# Patient Record
Sex: Female | Born: 1950 | Race: White | Hispanic: No | Marital: Married | Smoking: Former smoker
Health system: Southern US, Community
[De-identification: ages and names within clinical notes are randomized; demographics above are authoritative.]

## PROBLEM LIST (undated history)

## (undated) DIAGNOSIS — N83209 Unspecified ovarian cyst, unspecified side: Secondary | ICD-10-CM

## (undated) DIAGNOSIS — F329 Major depressive disorder, single episode, unspecified: Secondary | ICD-10-CM

## (undated) DIAGNOSIS — K635 Polyp of colon: Secondary | ICD-10-CM

## (undated) DIAGNOSIS — R059 Cough, unspecified: Secondary | ICD-10-CM

## (undated) DIAGNOSIS — F419 Anxiety disorder, unspecified: Secondary | ICD-10-CM

## (undated) DIAGNOSIS — K219 Gastro-esophageal reflux disease without esophagitis: Secondary | ICD-10-CM

## (undated) DIAGNOSIS — R05 Cough: Secondary | ICD-10-CM

## (undated) DIAGNOSIS — F32A Depression, unspecified: Secondary | ICD-10-CM

## (undated) DIAGNOSIS — I10 Essential (primary) hypertension: Secondary | ICD-10-CM

## (undated) HISTORY — DX: Anxiety disorder, unspecified: F41.9

## (undated) HISTORY — DX: Cough: R05

## (undated) HISTORY — DX: Gastro-esophageal reflux disease without esophagitis: K21.9

## (undated) HISTORY — DX: Polyp of colon: K63.5

## (undated) HISTORY — PX: WRIST SURGERY: SHX841

## (undated) HISTORY — DX: Major depressive disorder, single episode, unspecified: F32.9

## (undated) HISTORY — DX: Unspecified ovarian cyst, unspecified side: N83.209

## (undated) HISTORY — DX: Depression, unspecified: F32.A

## (undated) HISTORY — DX: Cough, unspecified: R05.9

---

## 1980-07-04 HISTORY — PX: TOTAL ABDOMINAL HYSTERECTOMY: SHX209

## 1980-07-04 HISTORY — PX: BREAST BIOPSY: SHX20

## 2002-07-04 HISTORY — PX: CERVICAL DISC SURGERY: SHX588

## 2012-06-19 ENCOUNTER — Emergency Department (INDEPENDENT_AMBULATORY_CARE_PROVIDER_SITE_OTHER)
Admission: EM | Admit: 2012-06-19 | Discharge: 2012-06-19 | Disposition: A | Discharge status: Home / Self Care | Payer: Commercial Managed Care - PPO | Source: Home / Self Care | Attending: Family Medicine | Admitting: Family Medicine

## 2012-06-19 ENCOUNTER — Encounter: Payer: Self-pay | Admitting: Emergency Medicine

## 2012-06-19 DIAGNOSIS — J069 Acute upper respiratory infection, unspecified: Secondary | ICD-10-CM

## 2012-06-19 HISTORY — DX: Essential (primary) hypertension: I10

## 2012-06-19 MED ORDER — BENZONATATE 200 MG PO CAPS: 200.0000 mg | ORAL_CAPSULE | Freq: Every day | ORAL | Status: DC

## 2012-06-19 MED ORDER — SULFAMETHOXAZOLE-TRIMETHOPRIM 800-160 MG PO TABS: 1.0000 | ORAL_TABLET | Freq: Two times a day (BID) | ORAL | Status: DC

## 2012-06-19 NOTE — ED Notes (Signed)
Dry cough, headache, sore throat, runny nose, exposed to strep from granddaughter, started amoxicillin x 5 days ago

## 2012-06-19 NOTE — ED Provider Notes (Signed)
History     CSN: [id number]  Arrival date & time 06/19/12  1054   First MD Initiated Contact with Patient 06/19/12 1124      Chief Complaint  Patient presents with  . Cough      HPI Comments: Patient complains of onset of a sore throat and headache about a week ago.  Her grand-daughter had been treated for strep, so patient began taking leftover amoxicillin about 5 days ago (500mg  TID).  A cough also started 5 days ago.  Complains of fatigue but no myalgias.  Cough is now worse at night and generally non-productive during the day.  She occasionally coughs until she gags.  There has been no pleuritic pain, shortness of breath, or wheezes.   The history is provided by the patient.    Past Medical History  Diagnosis Date  . Hypertension     History reviewed. No pertinent past surgical history.  Family History  Problem Relation Age of Onset  . Diabetes Mother   . Heart failure Mother     History  Substance Use Topics  . Smoking status: Former Smoker    Quit date: 06/20/1995  . Smokeless tobacco: Not on file  . Alcohol Use: No    OB History    Grav Para Term Preterm Abortions TAB SAB Ect Mult Living                  Review of Systems + sore throat + cough No pleuritic pain No wheezing + nasal congestion + post-nasal drainage No sinus pain/pressure No itchy/red eyes ? earache No hemoptysis No SOB No fever, + chills No nausea No vomiting No abdominal pain No diarrhea No urinary symptoms No skin rashes + fatigue No myalgias No headache Used OTC meds without relief  Allergies  Review of patient's allergies indicates no known allergies.  Home Medications   Current Outpatient Rx  Name  Route  Sig  Dispense  Refill  . AMOXICILLIN 500 MG PO TABS   Oral   Take 500 mg by mouth 2 (two) times daily.         . DULOXETINE HCL 60 MG PO CPEP   Oral   Take 60 mg by mouth daily.         . MOMETASONE FUROATE 50 MCG/ACT NA SUSP   Nasal   Place 2  sprays into the nose daily.         . NEBIVOLOL HCL 5 MG PO TABS   Oral   Take 5 mg by mouth daily.         . TRIAMTERENE-HCTZ 37.5-25 MG PO TABS   Oral   Take 1 tablet by mouth daily.         Marland Kitchen BENZONATATE 200 MG PO CAPS   Oral   Take 1 capsule (200 mg total) by mouth at bedtime. Take as needed for cough   12 capsule   0   . SULFAMETHOXAZOLE-TRIMETHOPRIM 800-160 MG PO TABS   Oral   Take 1 tablet by mouth 2 (two) times daily.   20 tablet   0     BP 151/85  Pulse 81  Temp 98.2 F (36.8 C) (Oral)  Resp 16  Ht 5\' 2"  (1.575 m)  Wt 190 lb (86.183 kg)  BMI 34.75 kg/m2  SpO2 96%  Physical Exam Nursing notes and Vital Signs reviewed. Appearance:  Patient appears stated age, and in no acute distress.  Patient is obese (BMI 34.8) Eyes:  Pupils are  equal, round, and reactive to light and accomodation.  Extraocular movement is intact.  Conjunctivae are not inflamed  Ears:  Canals normal.  Tympanic membranes normal.  Nose:  Mildly congested turbinates.  No sinus tenderness.   Pharynx:  Normal Neck:  Supple.  Slightly tender shotty posterior nodes are palpated bilaterally  Lungs:  Clear to auscultation.  Breath sounds are equal.  Heart:  Regular rate and rhythm without murmurs, rubs, or gallops.  Abdomen:  Nontender without masses or hepatosplenomegaly.  Bowel sounds are present.  No CVA or flank tenderness.  Extremities:  No edema.  No calf tenderness Skin:  No rash present.   ED Course  Procedures none   Labs Reviewed  STREP A DNA PROBE pending      1. Acute upper respiratory infections of unspecified site; ?pertussis       MDM  Since patient has been exposed to strep pharyngitis, well send throat culture (throat appears normal however) Begin Septra DS to cover possible pertussis. Prescription written for Benzonatate [facility]) to take at bedtime for night-time cough.  Take plain Mucinex (guaifenesin) twice daily for cough and congestion.  Increase fluid  intake, rest. May use Afrin nasal spray (or generic oxymetazoline) twice daily for about 5 days.  Also recommend using saline nasal spray several times daily and saline nasal irrigation (AYR is a common brand) Stop all antihistamines for now, and other non-prescription cough/cold preparations. Recommend Tdap when well Follow-up with family doctor if not improving 7 to 10 days.         Lattie Haw, MD 06/25/12 [phone]

## 2012-06-20 LAB — STREP A DNA PROBE: GASP: NEGATIVE

## 2012-06-23 ENCOUNTER — Encounter: Payer: Self-pay | Admitting: Emergency Medicine

## 2012-06-23 ENCOUNTER — Emergency Department (INDEPENDENT_AMBULATORY_CARE_PROVIDER_SITE_OTHER)
Admission: EM | Admit: 2012-06-23 | Discharge: 2012-06-23 | Disposition: A | Discharge status: Home / Self Care | Payer: Commercial Managed Care - PPO | Source: Home / Self Care | Attending: Family Medicine | Admitting: Family Medicine

## 2012-06-23 DIAGNOSIS — R05 Cough: Secondary | ICD-10-CM

## 2012-06-23 DIAGNOSIS — S39011A Strain of muscle, fascia and tendon of abdomen, initial encounter: Secondary | ICD-10-CM

## 2012-06-23 DIAGNOSIS — IMO0002 Reserved for concepts with insufficient information to code with codable children: Secondary | ICD-10-CM

## 2012-06-23 MED ORDER — HYDROCOD POLST-CHLORPHEN POLST 10-8 MG/5ML PO LQCR: 5.0000 mL | Freq: Two times a day (BID) | ORAL | Status: DC | PRN

## 2012-06-23 NOTE — ED Notes (Signed)
Reports feeling pulling sensation when she coughed the other night in her left groin area; was seen earlier in week for URI.

## 2012-06-23 NOTE — ED Provider Notes (Signed)
History     CSN: [id number]  Arrival date & time 06/23/12  1145   First MD Initiated Contact with Patient 06/23/12 1219      Chief Complaint  Patient presents with  . Groin Pain   HPI  Pt presents today with chief complaint of abd/groin pain x 2-3 days.  Pt noted have been seen for URI/strep throat earlier in the week. Rapid strep negative.  Was placed on septra for pertussis coverage. Also on tessalon perles for cough.  Pt states that she has had persistent cough. Pt states that she was getting out of bed earlier in the week and simultaneously coughed and noticed LLQ/groin pain.  Pain has been intermittent since this point. Only with coughing, hip flexion.  No numbness or paresthesias.  No bowel or bladder dysfunction.  No fevers or chills.  Appetite has been stable.  No fevers or chills.    Past Medical History  Diagnosis Date  . Hypertension     History reviewed. No pertinent past surgical history.  Family History  Problem Relation Age of Onset  . Diabetes Mother   . Heart failure Mother     History  Substance Use Topics  . Smoking status: Former Smoker    Quit date: 06/20/1995  . Smokeless tobacco: Not on file  . Alcohol Use: No    OB History    Grav Para Term Preterm Abortions TAB SAB Ect Mult Living                  Review of Systems  All other systems reviewed and are negative.    Allergies  Review of patient's allergies indicates no known allergies.  Home Medications   Current Outpatient Rx  Name  Route  Sig  Dispense  Refill  . AMOXICILLIN 500 MG PO TABS   Oral   Take 500 mg by mouth 2 (two) times daily.         Marland Kitchen BENZONATATE 200 MG PO CAPS   Oral   Take 1 capsule (200 mg total) by mouth at bedtime. Take as needed for cough   12 capsule   0   . HYDROCOD POLST-CPM POLST ER 10-8 MG/5ML PO LQCR   Oral   Take 5 mLs by mouth every 12 (twelve) hours as needed (cough).   60 mL   0   . DULOXETINE HCL 60 MG PO CPEP   Oral   Take  60 mg by mouth daily.         . MOMETASONE FUROATE 50 MCG/ACT NA SUSP   Nasal   Place 2 sprays into the nose daily.         . NEBIVOLOL HCL 5 MG PO TABS   Oral   Take 5 mg by mouth daily.         . SULFAMETHOXAZOLE-TRIMETHOPRIM 800-160 MG PO TABS   Oral   Take 1 tablet by mouth 2 (two) times daily.   20 tablet   0   . TRIAMTERENE-HCTZ 37.5-25 MG PO TABS   Oral   Take 1 tablet by mouth daily.           BP 134/77  Pulse 78  Temp 99.4 F (37.4 C) (Oral)  Resp 18  Ht 5\' 1"  (1.549 m)  Wt 191 lb (86.637 kg)  BMI 36.09 kg/m2  SpO2 95%  Physical Exam  Vitals reviewed. Constitutional: She appears well-developed and well-nourished.  HENT:  Head: Normocephalic and atraumatic.  Right Ear: External ear normal.  Left Ear: External ear normal.       Mild +nasal erythema, rhinorrhea bilaterally, + post oropharyngeal erythema    Eyes: Conjunctivae normal are normal. Pupils are equal, round, and reactive to light.  Neck: Normal range of motion. Neck supple.  Cardiovascular: Normal rate, regular rhythm and normal heart sounds.   Pulmonary/Chest: Effort normal and breath sounds normal.  Abdominal: Soft. Bowel sounds are normal.      ED Course  Procedures (including critical care time)  Labs Reviewed - No data to display No results found.   1. Abdominal muscle strain   2. Cough       MDM  Will place on tussionex for cough.  This should also help with abdominal strain.  RICE and tylenol.  Avoiding NSAIDs given HTN unless necessary.  Discussed infectious and GI red flags.  Follow up as needed.      The patient and/or caregiver has been counseled thoroughly with regard to treatment plan and/or medications prescribed including dosage, schedule, interactions, rationale for use, and possible side effects and they verbalize understanding. Diagnoses and expected course of recovery discussed and will return if not improved as expected or if the condition worsens.  Patient and/or caregiver verbalized understanding.             Doree Albee, MD 06/23/12 [phone]

## 2013-01-21 ENCOUNTER — Other Ambulatory Visit: Payer: Self-pay | Admitting: *Deleted

## 2013-01-21 ENCOUNTER — Encounter: Payer: Self-pay | Admitting: Critical Care Medicine

## 2013-01-21 ENCOUNTER — Ambulatory Visit (INDEPENDENT_AMBULATORY_CARE_PROVIDER_SITE_OTHER): Payer: Commercial Managed Care - PPO | Admitting: Critical Care Medicine

## 2013-01-21 VITALS — BP 142/84 | HR 75 | Temp 98.6°F | Ht 61.5 in | Wt 191.0 lb

## 2013-01-21 DIAGNOSIS — E559 Vitamin D deficiency, unspecified: Secondary | ICD-10-CM

## 2013-01-21 DIAGNOSIS — Z0000 Encounter for general adult medical examination without abnormal findings: Secondary | ICD-10-CM

## 2013-01-21 DIAGNOSIS — R05 Cough: Secondary | ICD-10-CM

## 2013-01-21 MED ORDER — HYDROCODONE-HOMATROPINE 5-1.5 MG/5ML PO SYRP: 5.0000 mL | ORAL_SOLUTION | Freq: Four times a day (QID) | ORAL | Status: DC | PRN

## 2013-01-21 MED ORDER — PREDNISONE 10 MG PO TABS: ORAL_TABLET | ORAL | Status: DC

## 2013-01-21 MED ORDER — BENZONATATE 200 MG PO CAPS: 200.0000 mg | ORAL_CAPSULE | Freq: Every day | ORAL | Status: DC

## 2013-01-21 MED ORDER — BENZONATATE 200 MG PO CAPS: ORAL_CAPSULE | ORAL | Status: DC

## 2013-01-21 MED ORDER — CHLORPHENIRAMINE MALEATE 4 MG PO TABS: 8.0000 mg | ORAL_TABLET | Freq: Every day | ORAL | Status: DC

## 2013-01-21 MED ORDER — MOMETASONE FUROATE 50 MCG/ACT NA SUSP: 2.0000 | Freq: Every day | NASAL | Status: DC

## 2013-01-21 NOTE — Patient Instructions (Addendum)
Stay on omeprazole daily take 1/2 hour before meals Reflux diet Stay on nasonex two puff daily  Start chlorpheniramine 8mg  every night Prednisone 10mg  Take 4 for two days three for two days two for two days one for two days Start Cyclic cough protocol with hycodan and benzonatate Return 1 month

## 2013-01-21 NOTE — Progress Notes (Signed)
Subjective:    Patient ID: Theresa Lamb, female    DOB: 1951-04-06, 62 y.o.   MRN: [id number]  HPI Comments: Chronic cough since November 2013.  Essentially dry cough.  Pt saw GI and ENT and dx reflux. Rx for same and helped some   Cough This is a new problem. The current episode started more than 1 month ago. The problem has been gradually improving. The cough is non-productive. Associated symptoms include nasal congestion, postnasal drip and rhinorrhea. Pertinent negatives include no chest pain, chills, ear congestion, ear pain, fever, headaches, heartburn, hemoptysis, myalgias, rash, sore throat, shortness of breath, sweats, weight loss or wheezing. Associated symptoms comments: Notes some sinus pressure, some clear rhinitis. Exacerbated by: worse after eating.  if talks alot  Risk factors for lung disease include smoking/tobacco exposure (quit smoking 16 yrs ago). She has tried prescription cough suppressant (hycodan helps at night) for the symptoms. The treatment provided significant relief. Her past medical history is significant for bronchitis. There is no history of asthma, bronchiectasis, COPD, emphysema, environmental allergies or pneumonia. dx bronchitis and Rx abx this past winter    Past Medical History  Diagnosis Date  . Hypertension   . Depression   . Anxiety   . Ovarian cyst   . GERD (gastroesophageal reflux disease)   . Colon polyp   . Cough      Family History  Problem Relation Age of Onset  . Diabetes Mother   . Heart failure Mother   . Heart disease Mother   . Cancer Mother     Uterine Cancer/ Bladder Cancer  . Heart disease Father   . Sarcoidosis Sister   . Heart disease Brother   . Diabetes Maternal Aunt   . Diabetes Maternal Uncle   . Diabetes Maternal Grandmother   . Melanoma Maternal Grandmother      History   Social History  . Marital Status: Married    Spouse Name: N/A    Number of Children: N/A  . Years of Education: N/A   Occupational  History  . Front Office    Social History Main Topics  . Smoking status: Former Smoker -- 1.00 packs/day for 15 years    Types: Cigarettes    Quit date: 05/04/1997  . Smokeless tobacco: Never Used  . Alcohol Use: No  . Drug Use: No  . Sexually Active: Yes   Other Topics Concern  . 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)    Education:  [employer]   Tobacco Use/Exposure: She quit smoking in 1998 after 20 years of 1/2 to 1 ppd   Alcohol Use:  Rarely   Drug Use:  None   Diet:  Weight Watchers   Exercise:  None   Hobbies:  Knitting              Allergies  Allergen Reactions  . Morphine And Related     itching     Outpatient Prescriptions Prior to Visit  Medication Sig Dispense Refill  . cyclobenzaprine (FLEXERIL) 10 MG tablet Take 10 mg by mouth 3 (three) times daily as needed for muscle spasms.      . DULoxetine (CYMBALTA) 60 MG capsule Take 60 mg by mouth daily.      . Estradiol (VAGIFEM) 10 MCG TABS Place 1 tablet vaginally 3 (three) times a week.      Marland Kitchen  nebivolol (BYSTOLIC) 5 MG tablet Take 5 mg by mouth daily.      Marland Kitchen omeprazole (PRILOSEC) 40 MG capsule Take 40 mg by mouth daily.      Marland Kitchen triamterene-hydrochlorothiazide (MAXZIDE-25) 37.5-25 MG per tablet Take 1 tablet by mouth daily.      . benzonatate (TESSALON) 200 MG capsule Take 1 capsule (200 mg total) by mouth at bedtime. Take as needed for cough  12 capsule  0  . HYDROcodone-homatropine (HYCODAN) 5-1.5 MG/5ML syrup Take 5 mLs by mouth every 6 (six) hours as needed for cough.      . mometasone (NASONEX) 50 MCG/ACT nasal spray Place 2 sprays into the nose daily as needed.       Marland Kitchen amoxicillin (AMOXIL) 500 MG tablet Take 500 mg by mouth 2 (two) times daily.      . chlorpheniramine-HYDROcodone (TUSSIONEX PENNKINETIC ER) 10-8 MG/5ML LQCR Take 5 mLs by mouth every 12 (twelve) hours as  needed (cough).  60 mL  0  . dexlansoprazole (DEXILANT) 60 MG capsule Take 60 mg by mouth daily.      . hyoscyamine (LEVSIN, ANASPAZ) 0.125 MG tablet Take 0.125 mg by mouth every 4 (four) hours as needed for cramping.      . sulfamethoxazole-trimethoprim (BACTRIM DS,SEPTRA DS) 800-160 MG per tablet Take 1 tablet by mouth 2 (two) times daily.  20 tablet  0  . zoster vaccine live, PF, (ZOSTAVAX) 65784 UNT/0.65ML injection Inject 0.65 mLs into the skin once.       No facility-administered medications prior to visit.      Review of Systems  Constitutional: Positive for fatigue. Negative for fever, chills, weight loss, diaphoresis, activity change, appetite change and unexpected weight change.  HENT: Positive for congestion, rhinorrhea, postnasal drip and sinus pressure. Negative for hearing loss, ear pain, nosebleeds, sore throat, facial swelling, sneezing, mouth sores, trouble swallowing, neck pain, neck stiffness, dental problem, voice change, tinnitus and ear discharge.   Eyes: Negative for photophobia, discharge, itching and visual disturbance.  Respiratory: Positive for cough. Negative for apnea, hemoptysis, choking, chest tightness, shortness of breath, wheezing and stridor.   Cardiovascular: Negative for chest pain, palpitations and leg swelling.  Gastrointestinal: Negative for heartburn, nausea, vomiting, abdominal pain, constipation, blood in stool and abdominal distention.  Genitourinary: Negative for dysuria, urgency, frequency, hematuria, flank pain, decreased urine volume and difficulty urinating.  Musculoskeletal: Negative for myalgias, back pain, joint swelling, arthralgias and gait problem.  Skin: Negative for color change, pallor and rash.  Allergic/Immunologic: Negative for environmental allergies.  Neurological: Negative for dizziness, tremors, seizures, syncope, speech difficulty, weakness, light-headedness, numbness and headaches.  Hematological: Negative for adenopathy. Does  not bruise/bleed easily.  Psychiatric/Behavioral: Negative for confusion, sleep disturbance and agitation. The patient is not nervous/anxious.        Objective:   Physical Exam Filed Vitals:   01/21/13 1114  BP: 142/84  Pulse: 75  Temp: 98.6 F (37 C)  TempSrc: Oral  Height: 5' 1.5" (1.562 m)  Weight: 191 lb (86.637 kg)  SpO2: 95%    Gen: Pleasant, well-nourished, in no distress,  normal affect  ENT: No lesions,  mouth clear,  oropharynx clear, +++ postnasal drip  Neck: No JVD, no TMG, no carotid bruits  Lungs: No use of accessory muscles, no dullness to percussion, prominent pseudowheeze  Cardiovascular: RRR, heart sounds normal, no murmur or gallops, no peripheral edema  Abdomen: soft and NT, no HSM,  BS normal  Musculoskeletal: No deformities, no cyanosis or clubbing  Neuro: alert,  non focal  Skin: Warm, no lesions or rashes  No results found.        Assessment & Plan:   Cough Cyclical cough d/t upper airway instability d/t post nasal drip, GERD. Doubt primary lung disease  Stay on omeprazole daily take 1/2 hour before meals Reflux diet Stay on nasonex two puff daily  Start chlorpheniramine 8mg  every night Prednisone 10mg  Take 4 for two days three for two days two for two days one for two days Start Cyclic cough protocol with hycodan and benzonatate Return 1 month     Updated Medication List Outpatient Encounter Prescriptions as of 01/21/2013  Medication Sig Dispense Refill  . benzonatate (TESSALON) 200 MG capsule Take per cough protocol  1-2 every 4hours as needed  90 capsule  4  . cetirizine (ZYRTEC) 10 MG tablet Take 10 mg by mouth daily.      . cholecalciferol (VITAMIN D) 1000 UNITS tablet Take 1,000 Units by mouth daily.      . cyclobenzaprine (FLEXERIL) 10 MG tablet Take 10 mg by mouth 3 (three) times daily as needed for muscle spasms.      . DULoxetine (CYMBALTA) 60 MG capsule Take 60 mg by mouth daily.      . Estradiol (VAGIFEM) 10 MCG TABS  Place 1 tablet vaginally 3 (three) times a week.      Marland Kitchen HYDROcodone-homatropine (HYCODAN) 5-1.5 MG/5ML syrup Take 5 mLs by mouth every 6 (six) hours as needed for cough.  120 mL  0  . mometasone (NASONEX) 50 MCG/ACT nasal spray Place 2 sprays into the nose daily.  17 g  6  . nebivolol (BYSTOLIC) 5 MG tablet Take 5 mg by mouth daily.      Marland Kitchen omeprazole (PRILOSEC) 40 MG capsule Take 40 mg by mouth daily.      Marland Kitchen triamterene-hydrochlorothiazide (MAXZIDE-25) 37.5-25 MG per tablet Take 1 tablet by mouth daily.      . [DISCONTINUED] benzonatate (TESSALON) 200 MG capsule Take 1 capsule (200 mg total) by mouth at bedtime. Take as needed for cough  12 capsule  0  . [DISCONTINUED] benzonatate (TESSALON) 200 MG capsule Take 1 capsule (200 mg total) by mouth at bedtime. Take as needed for cough  12 capsule  0  . [DISCONTINUED] HYDROcodone-homatropine (HYCODAN) 5-1.5 MG/5ML syrup Take 5 mLs by mouth every 6 (six) hours as needed for cough.      . [DISCONTINUED] HYDROcodone-homatropine (HYCODAN) 5-1.5 MG/5ML syrup Take 5 mLs by mouth every 6 (six) hours as needed for cough.  120 mL  0  . [DISCONTINUED] mometasone (NASONEX) 50 MCG/ACT nasal spray Place 2 sprays into the nose daily as needed.       . chlorpheniramine (CHLOR-TRIMETON) 4 MG tablet Take 2 tablets (8 mg total) by mouth at bedtime.  60 tablet  6  . predniSONE (DELTASONE) 10 MG tablet Take 4 for two days three for two days two for two days one for two days  20 tablet  0  . [DISCONTINUED] amoxicillin (AMOXIL) 500 MG tablet Take 500 mg by mouth 2 (two) times daily.      . [DISCONTINUED] chlorpheniramine-HYDROcodone (TUSSIONEX PENNKINETIC ER) 10-8 MG/5ML LQCR Take 5 mLs by mouth every 12 (twelve) hours as needed (cough).  60 mL  0  . [DISCONTINUED] dexlansoprazole (DEXILANT) 60 MG capsule Take 60 mg by mouth daily.      . [DISCONTINUED] hyoscyamine (LEVSIN, ANASPAZ) 0.125 MG tablet Take 0.125 mg by mouth every 4 (four) hours as needed for cramping.      .  [  DISCONTINUED] sulfamethoxazole-trimethoprim (BACTRIM DS,SEPTRA DS) 800-160 MG per tablet Take 1 tablet by mouth 2 (two) times daily.  20 tablet  0  . [DISCONTINUED] zoster vaccine live, PF, (ZOSTAVAX) 11914 UNT/0.65ML injection Inject 0.65 mLs into the skin once.       No facility-administered encounter medications on file as of 01/21/2013.

## 2013-01-21 NOTE — Assessment & Plan Note (Addendum)
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 Doubt primary lung disease  Stay on omeprazole daily take 1/2 hour before meals Reflux diet Stay on nasonex two puff daily  Start chlorpheniramine 8mg  every night Prednisone 10mg  Take 4 for two days three for two days two for two days one for two days Start Cyclic cough protocol with hycodan and benzonatate Return 1 month

## 2013-01-22 ENCOUNTER — Other Ambulatory Visit: Payer: Commercial Managed Care - PPO

## 2013-01-22 LAB — CBC WITH DIFFERENTIAL/PLATELET
Basophils Absolute: 0 10*3/uL (ref 0.0–0.1)
Basophils Relative: 1 % (ref 0–1)
Eosinophils Absolute: 0.1 10*3/uL (ref 0.0–0.7)
Eosinophils Relative: 2 % (ref 0–5)
HCT: 45.1 % (ref 36.0–46.0)
Hemoglobin: 15.3 g/dL — ABNORMAL HIGH (ref 12.0–15.0)
Lymphocytes Relative: 41 % (ref 12–46)
Lymphs Abs: 2.9 10*3/uL (ref 0.7–4.0)
MCH: 28.4 pg (ref 26.0–34.0)
MCHC: 33.9 g/dL (ref 30.0–36.0)
MCV: 83.7 fL (ref 78.0–100.0)
Monocytes Absolute: 0.6 10*3/uL (ref 0.1–1.0)
Monocytes Relative: 8 % (ref 3–12)
Neutro Abs: 3.6 10*3/uL (ref 1.7–7.7)
Neutrophils Relative %: 48 % (ref 43–77)
Platelets: 167 10*3/uL (ref 150–400)
RBC: 5.39 MIL/uL — ABNORMAL HIGH (ref 3.87–5.11)
RDW: 13.9 % (ref 11.5–15.5)
WBC: 7.2 10*3/uL (ref 4.0–10.5)

## 2013-01-23 LAB — COMPLETE METABOLIC PANEL WITH GFR
ALT: 34 U/L (ref 0–35)
AST: 32 U/L (ref 0–37)
Albumin: 4.4 g/dL (ref 3.5–5.2)
Alkaline Phosphatase: 65 U/L (ref 39–117)
BUN: 14 mg/dL (ref 6–23)
CO2: 29 mEq/L (ref 19–32)
Calcium: 9.5 mg/dL (ref 8.4–10.5)
Chloride: 100 mEq/L (ref 96–112)
Creat: 0.82 mg/dL (ref 0.50–1.10)
GFR, Est African American: 89 mL/min
GFR, Est Non African American: 77 mL/min
Glucose, Bld: 104 mg/dL — ABNORMAL HIGH (ref 70–99)
Potassium: 4 mEq/L (ref 3.5–5.3)
Sodium: 141 mEq/L (ref 135–145)
Total Bilirubin: 0.8 mg/dL (ref 0.3–1.2)
Total Protein: 7.1 g/dL (ref 6.0–8.3)

## 2013-01-23 LAB — VITAMIN D 25 HYDROXY (VIT D DEFICIENCY, FRACTURES): Vit D, 25-Hydroxy: 45 ng/mL (ref 30–89)

## 2013-01-23 LAB — LIPID PANEL
Cholesterol: 198 mg/dL (ref 0–200)
HDL: 51 mg/dL (ref >39)
LDL Cholesterol: 124 mg/dL — ABNORMAL HIGH (ref 0–99)
Total CHOL/HDL Ratio: 3.9 Ratio
Triglycerides: 115 mg/dL (ref <150)
VLDL: 23 mg/dL (ref 0–40)

## 2013-01-23 LAB — TSH: TSH: 0.703 u[IU]/mL (ref 0.350–4.500)

## 2013-01-28 ENCOUNTER — Other Ambulatory Visit: Payer: Self-pay | Admitting: *Deleted

## 2013-01-28 MED ORDER — DULOXETINE HCL 60 MG PO CPEP: 60.0000 mg | ORAL_CAPSULE | Freq: Every day | ORAL | Status: DC

## 2013-01-29 ENCOUNTER — Ambulatory Visit: Payer: Commercial Managed Care - PPO | Admitting: Family Medicine

## 2013-01-29 ENCOUNTER — Telehealth: Payer: Self-pay | Admitting: Critical Care Medicine

## 2013-01-29 NOTE — Telephone Encounter (Signed)
Would you be okay with writing a work note? Please advise. Thanks!

## 2013-01-29 NOTE — Telephone Encounter (Signed)
Yes i am ok with writing the note

## 2013-01-30 NOTE — Telephone Encounter (Signed)
ATC pt at work and she is on lunch. I [id number] on cell number. Need to clarify what exact dates the pt needs on the letter.  Carron Curie, CMA

## 2013-01-31 ENCOUNTER — Encounter: Payer: Self-pay | Admitting: *Deleted

## 2013-01-31 NOTE — Telephone Encounter (Signed)
I spoke with the pt and she was out of work on 01/25/13 and 01/28/13. She asks that the letter be faxed to [phone]. Letter done and faxed. Carron Curie, CMA

## 2013-02-12 ENCOUNTER — Encounter: Payer: Self-pay | Admitting: Critical Care Medicine

## 2013-02-19 ENCOUNTER — Ambulatory Visit (INDEPENDENT_AMBULATORY_CARE_PROVIDER_SITE_OTHER): Payer: Commercial Managed Care - PPO | Admitting: Family Medicine

## 2013-02-19 ENCOUNTER — Encounter: Payer: Self-pay | Admitting: Family Medicine

## 2013-02-19 VITALS — BP 131/84 | HR 74 | Wt 192.0 lb

## 2013-02-19 DIAGNOSIS — R7301 Impaired fasting glucose: Secondary | ICD-10-CM | POA: Insufficient documentation

## 2013-02-19 DIAGNOSIS — Z780 Asymptomatic menopausal state: Secondary | ICD-10-CM

## 2013-02-19 DIAGNOSIS — IMO0001 Reserved for inherently not codable concepts without codable children: Secondary | ICD-10-CM

## 2013-02-19 DIAGNOSIS — I10 Essential (primary) hypertension: Secondary | ICD-10-CM

## 2013-02-19 DIAGNOSIS — N951 Menopausal and female climacteric states: Secondary | ICD-10-CM

## 2013-02-19 MED ORDER — ESTRADIOL 10 MCG VA TABS: 1.0000 | ORAL_TABLET | VAGINAL | Status: DC

## 2013-02-19 MED ORDER — DULOXETINE HCL 60 MG PO CPEP: 60.0000 mg | ORAL_CAPSULE | Freq: Every day | ORAL | Status: DC

## 2013-02-19 MED ORDER — TRIAMTERENE-HCTZ 37.5-25 MG PO TABS: 1.0000 | ORAL_TABLET | Freq: Every day | ORAL | Status: DC

## 2013-02-19 NOTE — Assessment & Plan Note (Signed)
Refilled her Vagifem.  She was reminded to get her mammogram.

## 2013-02-19 NOTE — Patient Instructions (Addendum)
1)  Blood Sugar - You could try adding Cinnamon 1000 mg and Chromium 1000 mcg.                                                      Insulin Resistance Blood sugar (glucose) levels are controlled by a hormone called insulin. Insulin is made by your pancreas. When your blood glucose goes up, insulin is released into your blood. Insulin is required for your body to function normally. However, your body can become resistant to your own insulin or to insulin given to treat diabetes. In either case, insulin resistance can lead to serious problems. These problems include:  Type 2 diabetes.  Heart disease.  High blood pressure.  Stroke.  Polycystic ovary syndrome.  Fatty liver. CAUSES  Insulin resistance can develop for many different reasons. It is more likely to happen in people with these conditions or characteristics:  Obesity.  Inactivity.  Pregnancy.  High blood pressure.  Stress.  Steroid use.  Infection or severe illness.  Increased levels of cholesterol and triglycerides. SYMPTOMS  There are no symptoms. You may have symptoms related to the various complications of insulin resistance.  DIAGNOSIS  Several different things can make your caregiver suspect you have insulin resistance. These include:  High blood glucose (hyperglycemia).  Abnormal cholesterol levels.  High uric acid levels.  Changes related to blood pressure.  Changes related to inflammation. Insulin resistance can be determined with blood tests. An elevated insulin level when you have not eaten might suggest resistance. Other more complicated tests are sometimes necessary. TREATMENT  Lifestyle changes are the most important treatment for insulin resistance.   If you are overweight and you have insulin resistance, you can improve your insulin sensitivity by losing weight.  Moderate exercise for 30 40 minutes, 4 days a week, can improve insulin sensitivity. Some medicines can also help improve your  insulin sensitivity. Your caregiver can discuss these with you if they are appropriate.  HOME CARE INSTRUCTIONS   Do not smoke.  Keep your weight at a healthy level.  Get exercise.  If you have diabetes, follow your caregiver's directions.  If you have high blood pressure, follow your caregiver's directions.  Only take prescription medicines for pain, fever, or discomfort as directed by your caregiver. SEEK MEDICAL CARE IF:   You are diabetic and you are having problems keeping your blood glucose levels at target range.  You are having episodes of low blood glucose (hypoglycemia).  You feel you might be having side effects from your medicines.  You have symptoms of an illness that is not improving after 3 4 days.  You have a sore or wound that is not healing.  You notice a change in vision or a new problem with your vision. SEEK IMMEDIATE MEDICAL CARE IF:   Your blood glucose goes below 70, especially if you have confusion, lightheadedness, or other symptoms with it.  Your blood glucose is very high (as advised by your caregiver) twice in a row.  You pass out.  You have chest pain or trouble breathing.  You have a sudden, severe headache.  You have sudden weakness in one arm or one leg.  You have sudden difficulty speaking or swallowing.  You develop vomiting or diarrhea that is getting worse or not improving after 1 day. Document Released: 08/09/2005  Document Revised: 12/20/2011 Document Reviewed: 08/07/2008 [facility] Patient Information 2014 [place], [place].

## 2013-02-19 NOTE — Assessment & Plan Note (Signed)
Refilled her Cymbalta. 

## 2013-02-19 NOTE — Progress Notes (Signed)
  Subjective:    Patient ID: Theresa Lamb, female    DOB: [birth year], 62 y.o.   MRN: [id number]  HPI  [name] is here today to go over her most recent lab results, discuss the following conditions and get medication refills.     1)  Hypertension:  Her blood pressure is well controlled with Bystolic 5 mg  and triamterene/HCTZ.   2)  HRT:  She is doing well with her Vagifem and needs a refill on it.   3)  Fibromyalgia:  She needs a refill of her Cymbalta.     Review of Systems  Constitutional: Negative.   HENT: Negative.   Eyes: Negative.   Respiratory: Negative.   Cardiovascular: Negative.   Gastrointestinal: Negative.   Endocrine: Negative.   Genitourinary: Negative.   Musculoskeletal: Negative.   Skin: Negative.   Allergic/Immunologic: Negative.   Neurological: Negative.   Hematological: Negative.   Psychiatric/Behavioral: Negative.     Past Medical History  Diagnosis Date  . Hypertension   . Depression   . Anxiety   . Ovarian cyst   . GERD (gastroesophageal reflux disease)   . Colon polyp   . Cough     Family History  Problem Relation Age of Onset  . Diabetes Mother   . Heart failure Mother   . Heart disease Mother   . Cancer Mother     Uterine Cancer/ Bladder Cancer  . Heart disease Father   . Sarcoidosis Sister   . Heart disease Brother   . Diabetes Maternal Aunt   . Diabetes Maternal Uncle   . Diabetes Maternal Grandmother   . 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)    Education:  Engineer, agricultural   Tobacco Use/Exposure: She quit smoking in 1998 after 20 years of 1/2 to 1 ppd   Alcohol Use:  Rarely   Drug Use:  None   Diet:  Weight Watchers   Exercise:  None   Hobbies:  Knitting                Objective:   Physical Exam  Constitutional: She appears well-nourished. No  distress.  HENT:  Head: Normocephalic.  Eyes: No scleral icterus.  Neck: No thyromegaly present.  Cardiovascular: Normal rate, regular rhythm and normal heart sounds.   Pulmonary/Chest: Effort normal and breath sounds normal.  Abdominal: There is no tenderness.  Musculoskeletal: She exhibits no edema and no tenderness.  Neurological: She is alert.  Skin: Skin is warm and dry.  Psychiatric: She has a normal mood and affect. Her behavior is normal. Judgment and thought content normal.          Assessment & Plan:

## 2013-02-19 NOTE — Assessment & Plan Note (Signed)
She was given enough samples to last her for 3 months.  She may be losing her insurance with [facility].  If she has to go on [facility] with [employer] then we'll send in a prescriptions for her.

## 2013-02-19 NOTE — Assessment & Plan Note (Signed)
She is going to work harder on her diet and exercise. We'll recheck a FBS and A1c in 3-6 months.

## 2013-02-21 ENCOUNTER — Ambulatory Visit: Payer: Commercial Managed Care - PPO | Admitting: Critical Care Medicine

## 2013-02-21 ENCOUNTER — Encounter: Payer: Self-pay | Admitting: Critical Care Medicine

## 2013-02-21 ENCOUNTER — Ambulatory Visit (INDEPENDENT_AMBULATORY_CARE_PROVIDER_SITE_OTHER): Payer: Commercial Managed Care - PPO | Admitting: Critical Care Medicine

## 2013-02-21 VITALS — BP 138/70 | HR 97 | Ht 61.0 in | Wt 190.0 lb

## 2013-02-21 DIAGNOSIS — R05 Cough: Secondary | ICD-10-CM

## 2013-02-21 DIAGNOSIS — J329 Chronic sinusitis, unspecified: Secondary | ICD-10-CM

## 2013-02-21 DIAGNOSIS — H6120 Impacted cerumen, unspecified ear: Secondary | ICD-10-CM

## 2013-02-21 DIAGNOSIS — H6121 Impacted cerumen, right ear: Secondary | ICD-10-CM

## 2013-02-21 MED ORDER — MOMETASONE FUROATE 50 MCG/ACT NA SUSP: 2.0000 | Freq: Two times a day (BID) | NASAL | Status: DC

## 2013-02-21 MED ORDER — PREDNISONE 10 MG PO TABS: ORAL_TABLET | ORAL | Status: DC

## 2013-02-21 NOTE — Assessment & Plan Note (Signed)
Cyclical cough on the basis of upper airway instability due to postnasal drip with reflux disease. Doubt primary lung disease. Note right ear is completely impacted with cerumen. There is ongoing nasal inflammation as well. There is no evidence of any primary asthmatic condition Plan Get chlorpheniramine filled, try downstairs Get prednisone restarted 10mg  Take 4 for three days 3 for three days 2 for three days 1 for three days and stop (sent downstairs) Increase nasonex two puff ea nostril twice daily Stay on benzonatate on schedule for two more weeks Stay on reflux program Get R ear wax removed and see ENT for ear issues Get CT Sinus obtained Return 6 weeks

## 2013-02-21 NOTE — Patient Instructions (Addendum)
Get chlorpheniramine filled, try downstairs Get prednisone restarted 10mg  Take 4 for three days 3 for three days 2 for three days 1 for three days and stop (sent downstairs) Increase nasonex two puff ea nostril twice daily Stay on benzonatate on schedule for two more weeks Stay on reflux program Get R ear wax removed and see ENT for ear issues Get CT Sinus obtained Return 6 weeks

## 2013-02-21 NOTE — Progress Notes (Signed)
Subjective:    Patient ID: Theresa Lamb, female    DOB: 28-Nov-1950, 61 y.o.   MRN: [id number]  HPI Comments: Chronic cough since November 2013.  Essentially dry cough.  Pt saw GI and ENT and dx reflux. Rx for same and helped some   Cough This is a new problem. The current episode started more than 1 month ago. The problem has been gradually improving. The cough is non-productive. Associated symptoms include nasal congestion, postnasal drip and rhinorrhea. Pertinent negatives include no chest pain, chills, ear congestion, ear pain, fever, headaches, heartburn, hemoptysis, myalgias, rash, sore throat, shortness of breath, sweats, weight loss or wheezing. Associated symptoms comments: Notes some sinus pressure, some clear rhinitis. Exacerbated by: worse after eating.  if talks alot  Risk factors for lung disease include smoking/tobacco exposure (quit smoking 16 yrs ago). She has tried prescription cough suppressant (hycodan helps at night) for the symptoms. The treatment provided significant relief. Her past medical history is significant for bronchitis. There is no history of asthma, bronchiectasis, COPD, emphysema, environmental allergies or pneumonia. dx bronchitis and Rx abx this past winter   02/21/2013 Chief Complaint  Patient presents with  . Cough    Reports that her cough is better but is still present. Has more congestion again. R ear feels "stopped up."     Did well on protocol, no major side effects.   Cough is still dry.  Ears will pop open.   No cts done.     Past Medical History  Diagnosis Date  . Hypertension   . Depression   . Anxiety   . Ovarian cyst   . GERD (gastroesophageal reflux disease)   . Colon polyp   . Cough      Family History  Problem Relation Age of Onset  . Diabetes Mother   . Heart failure Mother   . Heart disease Mother   . Cancer Mother     Uterine Cancer/ Bladder Cancer  . Heart disease Father   . Sarcoidosis Sister   . Heart disease  Brother   . Diabetes Maternal Aunt   . Diabetes Maternal Uncle   . Diabetes Maternal Grandmother   . Melanoma Maternal Grandmother      History   Social History  . Marital Status: Married    Spouse Name: N/A    Number of Children: N/A  . Years of Education: N/A   Occupational History  . Front Office    Social History Main Topics  . Smoking status: Former Smoker -- 1.00 packs/day for 15 years    Types: Cigarettes    Quit date: 05/04/1997  . Smokeless tobacco: Never Used  . Alcohol Use: No  . Drug Use: No  . Sexual Activity: Yes   Other Topics Concern  . 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)    Education:  [employer]   Tobacco Use/Exposure: She quit smoking in 1998 after 20 years of 1/2 to 1 ppd   Alcohol Use:  Rarely   Drug Use:  None   Diet:  Weight Watchers   Exercise:  None   Hobbies:  Knitting              Allergies  Allergen Reactions  . Morphine And Related     itching     Outpatient Prescriptions Prior to Visit  Medication Sig  Dispense Refill  . benzonatate (TESSALON) 100 MG capsule Take 200 mg by mouth 3 (three) times daily as needed for cough.      . cetirizine (ZYRTEC) 10 MG tablet Take 5 mg by mouth daily.       . cyclobenzaprine (FLEXERIL) 10 MG tablet Take 10 mg by mouth 3 (three) times daily as needed for muscle spasms.      . DULoxetine (CYMBALTA) 60 MG capsule Take 1 capsule (60 mg total) by mouth daily.  30 capsule  5  . Estradiol (VAGIFEM) 10 MCG TABS vaginal tablet Place 1 tablet (10 mcg total) vaginally 3 (three) times a week.  12 tablet  11  . nebivolol (BYSTOLIC) 5 MG tablet Take 5 mg by mouth daily.      Marland Kitchen omeprazole (PRILOSEC) 40 MG capsule Take 40 mg by mouth daily.      Marland Kitchen triamterene-hydrochlorothiazide (MAXZIDE-25) 37.5-25 MG per tablet Take 1 tablet by mouth daily.  30 tablet  5  . mometasone  (NASONEX) 50 MCG/ACT nasal spray Place 2 sprays into the nose daily.  17 g  6  . cholecalciferol (VITAMIN D) 1000 UNITS tablet Take 1,000 Units by mouth daily.       No facility-administered medications prior to visit.      Review of Systems  Constitutional: Positive for fatigue. Negative for fever, chills, weight loss, diaphoresis, activity change, appetite change and unexpected weight change.  HENT: Positive for congestion, rhinorrhea, postnasal drip and sinus pressure. Negative for hearing loss, ear pain, nosebleeds, sore throat, facial swelling, sneezing, mouth sores, trouble swallowing, neck pain, neck stiffness, dental problem, voice change, tinnitus and ear discharge.   Eyes: Negative for photophobia, discharge, itching and visual disturbance.  Respiratory: Positive for cough. Negative for apnea, hemoptysis, choking, chest tightness, shortness of breath, wheezing and stridor.   Cardiovascular: Negative for chest pain, palpitations and leg swelling.  Gastrointestinal: Negative for heartburn, nausea, vomiting, abdominal pain, constipation, blood in stool and abdominal distention.  Genitourinary: Negative for dysuria, urgency, frequency, hematuria, flank pain, decreased urine volume and difficulty urinating.  Musculoskeletal: Negative for myalgias, back pain, joint swelling, arthralgias and gait problem.  Skin: Negative for color change, pallor and rash.  Allergic/Immunologic: Negative for environmental allergies.  Neurological: Negative for dizziness, tremors, seizures, syncope, speech difficulty, weakness, light-headedness, numbness and headaches.  Hematological: Negative for adenopathy. Does not bruise/bleed easily.  Psychiatric/Behavioral: Negative for confusion, sleep disturbance and agitation. The patient is not nervous/anxious.        Objective:   Physical Exam  Filed Vitals:   02/21/13 0907  BP: 138/70  Pulse: 97  Height: 5\' 1"  (1.549 m)  Weight: 190 lb (86.183 kg)   SpO2: 95%    Gen: Pleasant, well-nourished, in no distress,  normal affect  ENT: No lesions,  mouth clear,  oropharynx clear, +++ postnasal drip   the right ear is completely occluded with cerumen  Neck: No JVD, no TMG, no carotid bruits  Lungs: No use of accessory muscles, no dullness to percussion, prominent pseudowheeze  Cardiovascular: RRR, heart sounds normal, no murmur or gallops, no peripheral edema  Abdomen: soft and NT, no HSM,  BS normal  Musculoskeletal: No deformities, no cyanosis or clubbing  Neuro: alert, non focal  Skin: Warm, no lesions or rashes  No results found.        Assessment & Plan:   Cough Cyclical cough on the basis of upper airway instability due to postnasal drip with reflux disease. Doubt primary lung disease. Note  right ear is completely impacted with cerumen. There is ongoing nasal inflammation as well. There is no evidence of any primary asthmatic condition Plan Get chlorpheniramine filled, try downstairs Get prednisone restarted 10mg  Take 4 for three days 3 for three days 2 for three days 1 for three days and stop (sent downstairs) Increase nasonex two puff ea nostril twice daily Stay on benzonatate on schedule for two more weeks Stay on reflux program Get R ear wax removed and see ENT for ear issues Get CT Sinus obtained Return 6 weeks     Updated Medication List Outpatient Encounter Prescriptions as of 02/21/2013  Medication Sig Dispense Refill  . benzonatate (TESSALON) 100 MG capsule Take 200 mg by mouth 3 (three) times daily as needed for cough.      . cetirizine (ZYRTEC) 10 MG tablet Take 5 mg by mouth daily.       . cyclobenzaprine (FLEXERIL) 10 MG tablet Take 10 mg by mouth 3 (three) times daily as needed for muscle spasms.      . DULoxetine (CYMBALTA) 60 MG capsule Take 1 capsule (60 mg total) by mouth daily.  30 capsule  5  . Estradiol (VAGIFEM) 10 MCG TABS vaginal tablet Place 1 tablet (10 mcg total) vaginally 3 (three)  times a week.  12 tablet  11  . mometasone (NASONEX) 50 MCG/ACT nasal spray Place 2 sprays into the nose 2 (two) times daily.  17 g  6  . nebivolol (BYSTOLIC) 5 MG tablet Take 5 mg by mouth daily.      Marland Kitchen omeprazole (PRILOSEC) 40 MG capsule Take 40 mg by mouth daily.      Marland Kitchen triamterene-hydrochlorothiazide (MAXZIDE-25) 37.5-25 MG per tablet Take 1 tablet by mouth daily.  30 tablet  5  . [DISCONTINUED] mometasone (NASONEX) 50 MCG/ACT nasal spray Place 2 sprays into the nose daily.  17 g  6  . cholecalciferol (VITAMIN D) 1000 UNITS tablet Take 1,000 Units by mouth daily.      . predniSONE (DELTASONE) 10 MG tablet Take 4 for three days 3 for three days 2 for three days 1 for three days and stop  30 tablet  0   No facility-administered encounter medications on file as of 02/21/2013.

## 2013-02-23 ENCOUNTER — Other Ambulatory Visit (HOSPITAL_BASED_OUTPATIENT_CLINIC_OR_DEPARTMENT_OTHER): Payer: Commercial Managed Care - PPO

## 2013-02-25 ENCOUNTER — Ambulatory Visit (HOSPITAL_BASED_OUTPATIENT_CLINIC_OR_DEPARTMENT_OTHER)
Admission: RE | Admit: 2013-02-25 | Discharge: 2013-02-25 | Disposition: A | Discharge status: Home / Self Care | Payer: Commercial Managed Care - PPO | Source: Ambulatory Visit | Attending: Critical Care Medicine | Admitting: Critical Care Medicine

## 2013-02-25 DIAGNOSIS — J329 Chronic sinusitis, unspecified: Secondary | ICD-10-CM | POA: Insufficient documentation

## 2013-02-25 DIAGNOSIS — R05 Cough: Secondary | ICD-10-CM

## 2013-02-25 NOTE — Progress Notes (Signed)
Quick Note:  Notify the patient that the CT Sinus was NORMAL, No Chronic Sinusitis No change in medications are recommended. Continue current meds as prescribed at last office visit ______

## 2013-02-25 NOTE — Progress Notes (Signed)
Quick Note:  Called, spoke with pt. Informed her of CT Sinus results and recs per Dr. Delford Field. She verbalized understanding and voiced no further questions or concerns at this time. ______

## 2013-04-02 ENCOUNTER — Encounter: Payer: Self-pay | Admitting: *Deleted

## 2013-04-08 ENCOUNTER — Ambulatory Visit: Payer: Commercial Managed Care - PPO | Admitting: Critical Care Medicine

## 2013-04-15 ENCOUNTER — Ambulatory Visit (INDEPENDENT_AMBULATORY_CARE_PROVIDER_SITE_OTHER): Admitting: Critical Care Medicine

## 2013-04-15 ENCOUNTER — Encounter: Payer: Self-pay | Admitting: Critical Care Medicine

## 2013-04-15 VITALS — BP 146/88 | HR 78 | Temp 99.1°F | Ht 61.0 in | Wt 193.0 lb

## 2013-04-15 DIAGNOSIS — R05 Cough: Secondary | ICD-10-CM

## 2013-04-15 MED ORDER — CHLORPHENIRAMINE TANNATE 12 MG PO TABS: 6.0000 mg | ORAL_TABLET | Freq: Every day | ORAL | Status: DC

## 2013-04-15 NOTE — Assessment & Plan Note (Signed)
Cyclical cough due to upper airway instability due to postnasal drip and reflux disease without primary lung disease Plan Use hycodan cough syrup again for cough control and cough protocol for 3 days then switch back to benzonatate Reduce chlorpheniramine to 6mg  daily Return 3 months Flu vaccine was given

## 2013-04-15 NOTE — Progress Notes (Signed)
Subjective:    Patient ID: Theresa Lamb, female    DOB: [birth year], 62 y.o.   MRN: [id number]  HPI Comments: Chronic cough since November 2013.  Essentially dry cough.  Pt saw GI and ENT and dx reflux. Rx for same and helped some  04/15/2013 Chief Complaint  Patient presents with  . 6 wk follow up    Reports cough is better but still present and is nonprod.  Also, feels some nasal congestion. No SOB, wheezing, chest tightness, or chest pain.    Ct sinus neg.  the patient had cerumen removed from both ears. ENT prescribed Neurontin but the patient had side effects with the Neurontin. The patient also notes that chlorpheniramine causes severe dryness of the mouth. She denies that there is not as much postnasal drainage. Her cough overall is much improved from previous encounters.    Past Medical History  Diagnosis Date  . Hypertension   . Depression   . Anxiety   . Ovarian cyst   . GERD (gastroesophageal reflux disease)   . Colon polyp   . Cough      Family History  Problem Relation Age of Onset  . Diabetes Mother   . Heart failure Mother   . Heart disease Mother   . Cancer Mother     Uterine Cancer/ Bladder Cancer  . Heart disease Father   . Sarcoidosis Sister   . Heart disease Brother   . Diabetes Maternal Aunt   . Diabetes Maternal Uncle   . Diabetes Maternal Grandmother   . Melanoma Maternal Grandmother      History   Social History  . Marital Status: Married    Spouse Name: N/A    Number of Children: N/A  . Years of Education: N/A   Occupational History  . Front Office    Social History Main Topics  . Smoking status: Former Smoker -- 1.00 packs/day for 15 years    Types: Cigarettes    Quit date: 05/04/1997  . Smokeless tobacco: Never Used  . Alcohol Use: No  . Drug Use: No  . Sexual Activity: Yes   Other Topics Concern  . 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)    Education:  [employer]   Tobacco Use/Exposure: She quit smoking in 1998 after 20 years of 1/2 to 1 ppd   Alcohol Use:  Rarely   Drug Use:  None   Diet:  Weight Watchers   Exercise:  None   Hobbies:  Knitting              Allergies  Allergen Reactions  . Morphine And Related     itching     Outpatient Prescriptions Prior to Visit  Medication Sig Dispense Refill  . benzonatate (TESSALON) 100 MG capsule Take 200 mg by mouth 3 (three) times daily as needed for cough.      . cetirizine (ZYRTEC) 10 MG tablet Take 5 mg by mouth daily.       . cholecalciferol (VITAMIN D) 1000 UNITS tablet Take 1,000 Units by mouth daily.      . cyclobenzaprine (FLEXERIL) 10 MG tablet Take 10 mg by mouth 3 (three) times daily as needed for muscle spasms.      . DULoxetine (CYMBALTA) 60 MG capsule Take 1 capsule (60 mg total) by mouth daily.  30 capsule  5  .  Estradiol (VAGIFEM) 10 MCG TABS vaginal tablet Place 1 tablet (10 mcg total) vaginally 3 (three) times a week.  12 tablet  11  . mometasone (NASONEX) 50 MCG/ACT nasal spray Place 2 sprays into the nose 2 (two) times daily.  17 g  6  . nebivolol (BYSTOLIC) 5 MG tablet Take 5 mg by mouth daily.      Marland Kitchen omeprazole (PRILOSEC) 40 MG capsule Take 40 mg by mouth daily.      Marland Kitchen triamterene-hydrochlorothiazide (MAXZIDE-25) 37.5-25 MG per tablet Take 1 tablet by mouth daily.  30 tablet  5  . predniSONE (DELTASONE) 10 MG tablet Take 4 for three days 3 for three days 2 for three days 1 for three days and stop  30 tablet  0   No facility-administered medications prior to visit.      Review of Systems  Constitutional: Positive for fatigue. Negative for diaphoresis, activity change, appetite change and unexpected weight change.  HENT: Positive for congestion and sinus pressure. Negative for dental problem, ear discharge, facial swelling, hearing loss, mouth sores, nosebleeds, sneezing, tinnitus, trouble  swallowing and voice change.   Eyes: Negative for photophobia, discharge, itching and visual disturbance.  Respiratory: Negative for apnea, choking, chest tightness and stridor.   Cardiovascular: Negative for palpitations and leg swelling.  Gastrointestinal: Negative for nausea, vomiting, abdominal pain, constipation, blood in stool and abdominal distention.  Genitourinary: Negative for dysuria, urgency, frequency, hematuria, flank pain, decreased urine volume and difficulty urinating.  Musculoskeletal: Negative for arthralgias, back pain, gait problem, joint swelling, neck pain and neck stiffness.  Skin: Negative for color change and pallor.  Neurological: Negative for dizziness, tremors, seizures, syncope, speech difficulty, weakness, light-headedness and numbness.  Hematological: Negative for adenopathy. Does not bruise/bleed easily.  Psychiatric/Behavioral: Negative for confusion, sleep disturbance and agitation. The patient is not nervous/anxious.        Objective:   Physical Exam  Filed Vitals:   04/15/13 0942  BP: 146/88  Pulse: 78  Temp: 99.1 F (37.3 C)  TempSrc: Oral  Height: 5\' 1"  (1.549 m)  Weight: 193 lb (87.544 kg)  SpO2: 96%    Gen: Pleasant, well-nourished, in no distress,  normal affect  ENT: No lesions,  mouth clear,  oropharynx clear, +++ postnasal drip   the right ear is completely occluded with cerumen  Neck: No JVD, no TMG, no carotid bruits  Lungs: No use of accessory muscles, no dullness to percussion,   Cardiovascular: RRR, heart sounds normal, no murmur or gallops, no peripheral edema  Abdomen: soft and NT, no HSM,  BS normal  Musculoskeletal: No deformities, no cyanosis or clubbing  Neuro: alert, non focal  Skin: Warm, no lesions or rashes  No results found.    Assessment & Plan:   Cough Cyclical cough due to upper airway instability due to postnasal drip and reflux disease without primary lung disease Plan Use hycodan cough syrup again  for cough control and cough protocol for 3 days then switch back to benzonatate Reduce chlorpheniramine to 6mg  daily Return 3 months Flu vaccine was given     Updated Medication List Outpatient Encounter Prescriptions as of 04/15/2013  Medication Sig Dispense Refill  . benzonatate (TESSALON) 100 MG capsule Take 200 mg by mouth 3 (three) times daily as needed for cough.      . cetirizine (ZYRTEC) 10 MG tablet Take 5 mg by mouth daily.       . cholecalciferol (VITAMIN D) 1000 UNITS tablet Take 1,000 Units by mouth daily.      Marland Kitchen  cyclobenzaprine (FLEXERIL) 10 MG tablet Take 10 mg by mouth 3 (three) times daily as needed for muscle spasms.      . DULoxetine (CYMBALTA) 60 MG capsule Take 1 capsule (60 mg total) by mouth daily.  30 capsule  5  . Estradiol (VAGIFEM) 10 MCG TABS vaginal tablet Place 1 tablet (10 mcg total) vaginally 3 (three) times a week.  12 tablet  11  . mometasone (NASONEX) 50 MCG/ACT nasal spray Place 2 sprays into the nose 2 (two) times daily.  17 g  6  . nabumetone (RELAFEN) 750 MG tablet Take 750 mg by mouth 2 (two) times daily.      . nebivolol (BYSTOLIC) 5 MG tablet Take 5 mg by mouth daily.      Marland Kitchen omeprazole (PRILOSEC) 40 MG capsule Take 40 mg by mouth daily.      Marland Kitchen triamterene-hydrochlorothiazide (MAXZIDE-25) 37.5-25 MG per tablet Take 1 tablet by mouth daily.  30 tablet  5  . Chlorpheniramine Tannate 12 MG TABS Take 0.5 tablets (6 mg total) by mouth at bedtime.  14 each  0  . [DISCONTINUED] predniSONE (DELTASONE) 10 MG tablet Take 4 for three days 3 for three days 2 for three days 1 for three days and stop  30 tablet  0   No facility-administered encounter medications on file as of 04/15/2013.

## 2013-04-15 NOTE — Patient Instructions (Signed)
Use hycodan cough syrup again for cough control and cough protocol for 3 days then switch back to benzonatate Reduce chlorpheniramine to 6mg  daily Return 3 months Flu vaccine was given

## 2013-04-17 ENCOUNTER — Telehealth: Payer: Self-pay | Admitting: Critical Care Medicine

## 2013-04-17 NOTE — Telephone Encounter (Signed)
I spoke with pt. She is needing a refill on nasonex and [facility]ate [facility]. I advised will see if okay to send for 90 days for the benzonatate. Please advise Dr. Delford Field thanks

## 2013-04-17 NOTE — Telephone Encounter (Signed)
This is ok

## 2013-04-18 MED ORDER — BENZONATATE 100 MG PO CAPS: 200.0000 mg | ORAL_CAPSULE | Freq: Three times a day (TID) | ORAL | Status: DC | PRN

## 2013-04-18 MED ORDER — MOMETASONE FUROATE 50 MCG/ACT NA SUSP: 2.0000 | Freq: Two times a day (BID) | NASAL | Status: DC

## 2013-04-18 NOTE — Telephone Encounter (Signed)
Rx's have been sent in. Pt is aware. 

## 2013-04-19 ENCOUNTER — Other Ambulatory Visit: Payer: Self-pay | Admitting: Family Medicine

## 2013-04-19 DIAGNOSIS — IMO0001 Reserved for inherently not codable concepts without codable children: Secondary | ICD-10-CM

## 2013-04-19 DIAGNOSIS — I10 Essential (primary) hypertension: Secondary | ICD-10-CM

## 2013-04-19 DIAGNOSIS — Z780 Asymptomatic menopausal state: Secondary | ICD-10-CM

## 2013-04-19 MED ORDER — TRIAMTERENE-HCTZ 37.5-25 MG PO TABS: 1.0000 | ORAL_TABLET | Freq: Every day | ORAL | Status: DC

## 2013-04-19 MED ORDER — DULOXETINE HCL 60 MG PO CPEP: 60.0000 mg | ORAL_CAPSULE | Freq: Every day | ORAL | Status: DC

## 2013-04-19 MED ORDER — ESTRADIOL 10 MCG VA TABS: 1.0000 | ORAL_TABLET | VAGINAL | Status: DC

## 2013-04-19 MED ORDER — NEBIVOLOL HCL 5 MG PO TABS: 5.0000 mg | ORAL_TABLET | Freq: Every day | ORAL | Status: DC

## 2013-08-14 ENCOUNTER — Telehealth: Payer: Self-pay | Admitting: Pulmonary Disease

## 2013-08-14 MED ORDER — BENZONATATE 100 MG PO CAPS: 200.0000 mg | ORAL_CAPSULE | Freq: Three times a day (TID) | ORAL | Status: DC | PRN

## 2013-08-14 NOTE — Telephone Encounter (Signed)
I have sent RX into [facility]. Pt is aware and needed nothing further

## 2013-08-14 NOTE — Telephone Encounter (Signed)
PT LAST HAD REFILLED 04/18/13 #180 X 1 REFILL Last OV 04/15/13 Pending 09/10/12 Please advise Dr. Delford FieldWright thanks

## 2013-08-14 NOTE — Telephone Encounter (Signed)
i am ok with refill 

## 2013-08-22 ENCOUNTER — Ambulatory Visit: Payer: Commercial Managed Care - PPO | Admitting: Family Medicine

## 2013-09-10 ENCOUNTER — Ambulatory Visit (INDEPENDENT_AMBULATORY_CARE_PROVIDER_SITE_OTHER): Payer: Commercial Managed Care - PPO | Admitting: Critical Care Medicine

## 2013-09-10 ENCOUNTER — Encounter: Payer: Self-pay | Admitting: Critical Care Medicine

## 2013-09-10 VITALS — BP 134/74 | HR 80 | Temp 98.6°F | Ht 60.5 in | Wt 194.0 lb

## 2013-09-10 DIAGNOSIS — R059 Cough, unspecified: Secondary | ICD-10-CM

## 2013-09-10 DIAGNOSIS — R05 Cough: Secondary | ICD-10-CM

## 2013-09-10 MED ORDER — BENZONATATE 100 MG PO CAPS: 200.0000 mg | ORAL_CAPSULE | Freq: Three times a day (TID) | ORAL | Status: DC | PRN

## 2013-09-10 MED ORDER — MOMETASONE FUROATE 50 MCG/ACT NA SUSP: 2.0000 | Freq: Two times a day (BID) | NASAL | Status: AC

## 2013-09-10 NOTE — Patient Instructions (Signed)
Continue benzonatate as needed, refills sent Stay on nasonex, refill sent Use saline nasal spray 3-4 puffs each nostril 2-3 times a day Stay on omeprazole Continue sugar free lozenge Return 6 months

## 2013-09-10 NOTE — Progress Notes (Signed)
Subjective:    Patient ID: Theresa Lamb, female    DOB: 11-20-1950, 63 y.o.   MRN: [id number]  HPI Comments: Chronic cough since November 2013.  Essentially dry cough.  Pt saw GI and ENT and dx reflux. Rx for same and helped some     09/10/2013 Chief Complaint  Patient presents with  . Follow-up    Good days and bad days with cough.  Is worse on days when congestion is present.  Cough is nonprod.     Pt still notes a cough but is better.  Comes and goes.  Always worse with nasal congestion.  Pt feels dried out.  Notes daytime sleepiness.  Never had a sleep study. Using nasonex .  Off chlorpheniramine d/t too dry out Using benzonatate.     Past Medical History  Diagnosis Date  . Hypertension   . Depression   . Anxiety   . Ovarian cyst   . GERD (gastroesophageal reflux disease)   . Colon polyp   . Cough      Family History  Problem Relation Age of Onset  . Diabetes Mother   . Heart failure Mother   . Heart disease Mother   . Cancer Mother     Uterine Cancer/ Bladder Cancer  . Heart disease Father   . Sarcoidosis Sister   . Heart disease Brother   . Diabetes Maternal Aunt   . Diabetes Maternal Uncle   . Diabetes Maternal Grandmother   . Melanoma Maternal Grandmother      History   Social History  . Marital Status: Married    Spouse Name: N/A    Number of Children: N/A  . Years of Education: N/A   Occupational History  . Front Office    Social History Main Topics  . Smoking status: Former Smoker -- 1.00 packs/day for 15 years    Types: Cigarettes    Quit date: 05/04/1997  . Smokeless tobacco: Never Used  . Alcohol Use: No  . Drug Use: No  . Sexual Activity: Yes   Other Topics Concern  . 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)    Education:  [employer]   Tobacco Use/Exposure: She quit smoking in 1998 after 20  years of 1/2 to 1 ppd   Alcohol Use:  Rarely   Drug Use:  None   Diet:  Weight Watchers   Exercise:  None   Hobbies:  Knitting              Allergies  Allergen Reactions  . Morphine And Related     itching     Outpatient Prescriptions Prior to Visit  Medication Sig Dispense Refill  . benzonatate (TESSALON) 100 MG capsule Take 2 capsules (200 mg total) by mouth 3 (three) times daily as needed for cough.  180 capsule  1  . cetirizine (ZYRTEC) 10 MG tablet Take 5 mg by mouth daily.       . cholecalciferol (VITAMIN D) 1000 UNITS tablet Take 1,000 Units by mouth daily.      . cyclobenzaprine (FLEXERIL) 10 MG tablet Take 10 mg by mouth 3 (three) times daily as needed for muscle spasms.      . DULoxetine (CYMBALTA) 60 MG capsule Take 1 capsule (60 mg total) by mouth daily.  90 capsule  1  . Estradiol (VAGIFEM) 10 MCG TABS  vaginal tablet Place 1 tablet (10 mcg total) vaginally 3 (three) times a week.  36 tablet  1  . mometasone (NASONEX) 50 MCG/ACT nasal spray Place 2 sprays into the nose 2 (two) times daily.  51 g  1  . nebivolol (BYSTOLIC) 5 MG tablet Take 1 tablet (5 mg total) by mouth daily.  90 tablet  1  . omeprazole (PRILOSEC) 40 MG capsule Take 20 mg by mouth daily.       Marland Kitchen. triamterene-hydrochlorothiazide (MAXZIDE-25) 37.5-25 MG per tablet Take 1 tablet by mouth daily.  90 tablet  1  . Chlorpheniramine Tannate 12 MG TABS Take 0.5 tablets (6 mg total) by mouth at bedtime.  14 each  0  . nabumetone (RELAFEN) 750 MG tablet Take 750 mg by mouth 2 (two) times daily.       No facility-administered medications prior to visit.      Review of Systems  Constitutional: Positive for fatigue. Negative for diaphoresis, activity change, appetite change and unexpected weight change.  HENT: Positive for congestion and sinus pressure. Negative for dental problem, ear discharge, facial swelling, hearing loss, mouth sores, nosebleeds, sneezing, tinnitus, trouble swallowing and voice change.    Eyes: Negative for photophobia, discharge, itching and visual disturbance.  Respiratory: Negative for apnea, choking, chest tightness and stridor.   Cardiovascular: Negative for palpitations and leg swelling.  Gastrointestinal: Negative for nausea, vomiting, abdominal pain, constipation, blood in stool and abdominal distention.  Genitourinary: Negative for dysuria, urgency, frequency, hematuria, flank pain, decreased urine volume and difficulty urinating.  Musculoskeletal: Negative for arthralgias, back pain, gait problem, joint swelling, neck pain and neck stiffness.  Skin: Negative for color change and pallor.  Neurological: Negative for dizziness, tremors, seizures, syncope, speech difficulty, weakness, light-headedness and numbness.  Hematological: Negative for adenopathy. Does not bruise/bleed easily.  Psychiatric/Behavioral: Negative for confusion, sleep disturbance and agitation. The patient is not nervous/anxious.        Objective:   Physical Exam  Filed Vitals:   09/10/13 0905  BP: 134/74  Pulse: 80  Temp: 98.6 F (37 C)  TempSrc: Oral  Height: 5' 0.5" (1.537 m)  Weight: 194 lb (87.998 kg)  SpO2: 93%    Gen: Pleasant, well-nourished, in no distress,  normal affect  ENT: No lesions,  mouth clear,  oropharynx clear, + postnasal drip     Neck: No JVD, no TMG, no carotid bruits  Lungs: No use of accessory muscles, no dullness to percussion,   Cardiovascular: RRR, heart sounds normal, no murmur or gallops, no peripheral edema  Abdomen: soft and NT, no HSM,  BS normal  Musculoskeletal: No deformities, no cyanosis or clubbing  Neuro: alert, non focal  Skin: Warm, no lesions or rashes  No results found.    Assessment & Plan:   Cough Cyclical cough d/t upper airway instability and postnasal drip along with GERD.  No true asthma or primary lung disease Pt ?s need for sleep evaluation /screening Plan Continue benzonatate as needed, refills sent Stay on nasonex,  refill sent Use saline nasal spray 3-4 puffs each nostril 2-3 times a day Stay on omeprazole Continue sugar free lozenge Return 6 months  We can refer to Dr Cyril Mourningakesh Alva for sleep eval if pt desires, she will consider     Updated Medication List Outpatient Encounter Prescriptions as of 09/10/2013  Medication Sig  . benzonatate (TESSALON) 100 MG capsule Take 2 capsules (200 mg total) by mouth 3 (three) times daily as needed for cough.  . cetirizine (  ZYRTEC) 10 MG tablet Take 5 mg by mouth daily.   . cholecalciferol (VITAMIN D) 1000 UNITS tablet Take 1,000 Units by mouth daily.  . cyclobenzaprine (FLEXERIL) 10 MG tablet Take 10 mg by mouth 3 (three) times daily as needed for muscle spasms.  . DULoxetine (CYMBALTA) 60 MG capsule Take 1 capsule (60 mg total) by mouth daily.  . Estradiol (VAGIFEM) 10 MCG TABS vaginal tablet Place 1 tablet (10 mcg total) vaginally 3 (three) times a week.  . mometasone (NASONEX) 50 MCG/ACT nasal spray Place 2 sprays into the nose 2 (two) times daily.  . nebivolol (BYSTOLIC) 5 MG tablet Take 1 tablet (5 mg total) by mouth daily.  Marland Kitchen omeprazole (PRILOSEC) 40 MG capsule Take 20 mg by mouth daily.   Marland Kitchen triamterene-hydrochlorothiazide (MAXZIDE-25) 37.5-25 MG per tablet Take 1 tablet by mouth daily.  . Chlorpheniramine Tannate 12 MG TABS Take 0.5 tablets (6 mg total) by mouth at bedtime.  . [DISCONTINUED] nabumetone (RELAFEN) 750 MG tablet Take 750 mg by mouth 2 (two) times daily.

## 2013-09-10 NOTE — Assessment & Plan Note (Signed)
Cyclical cough d/t upper airway instability and postnasal drip along with GERD.  No true asthma or primary lung disease Pt ?s need for sleep evaluation /screening Plan Continue benzonatate as needed, refills sent Stay on nasonex, refill sent Use saline nasal spray 3-4 puffs each nostril 2-3 times a day Stay on omeprazole Continue sugar free lozenge Return 6 months  We can refer to Dr Cyril Mourningakesh Alva for sleep eval if pt desires, she will consider

## 2013-09-13 ENCOUNTER — Encounter: Payer: Self-pay | Admitting: Family Medicine

## 2013-09-13 ENCOUNTER — Ambulatory Visit (INDEPENDENT_AMBULATORY_CARE_PROVIDER_SITE_OTHER): Payer: Commercial Managed Care - PPO | Admitting: Family Medicine

## 2013-09-13 VITALS — BP 146/76 | HR 77 | Resp 16 | Ht 60.25 in | Wt 192.0 lb

## 2013-09-13 DIAGNOSIS — R059 Cough, unspecified: Secondary | ICD-10-CM

## 2013-09-13 DIAGNOSIS — K219 Gastro-esophageal reflux disease without esophagitis: Secondary | ICD-10-CM

## 2013-09-13 DIAGNOSIS — N951 Menopausal and female climacteric states: Secondary | ICD-10-CM

## 2013-09-13 DIAGNOSIS — IMO0001 Reserved for inherently not codable concepts without codable children: Secondary | ICD-10-CM

## 2013-09-13 DIAGNOSIS — J302 Other seasonal allergic rhinitis: Secondary | ICD-10-CM

## 2013-09-13 DIAGNOSIS — R05 Cough: Secondary | ICD-10-CM

## 2013-09-13 DIAGNOSIS — J309 Allergic rhinitis, unspecified: Secondary | ICD-10-CM

## 2013-09-13 DIAGNOSIS — M1990 Unspecified osteoarthritis, unspecified site: Secondary | ICD-10-CM

## 2013-09-13 DIAGNOSIS — I10 Essential (primary) hypertension: Secondary | ICD-10-CM

## 2013-09-13 DIAGNOSIS — Z780 Asymptomatic menopausal state: Secondary | ICD-10-CM

## 2013-09-13 MED ORDER — TELMISARTAN 80 MG PO TABS: 80.0000 mg | ORAL_TABLET | Freq: Every day | ORAL | Status: DC

## 2013-09-13 MED ORDER — ESTRADIOL 10 MCG VA TABS: 1.0000 | ORAL_TABLET | VAGINAL | Status: DC

## 2013-09-13 MED ORDER — DICLOFENAC SODIUM 1 % TD GEL: 4.0000 g | Freq: Four times a day (QID) | TRANSDERMAL | Status: DC

## 2013-09-13 MED ORDER — NEBIVOLOL HCL 5 MG PO TABS: 5.0000 mg | ORAL_TABLET | Freq: Every day | ORAL | Status: AC

## 2013-09-13 MED ORDER — TRIAMTERENE-HCTZ 37.5-25 MG PO TABS: 1.0000 | ORAL_TABLET | Freq: Every day | ORAL | Status: AC

## 2013-09-13 MED ORDER — BENZONATATE 200 MG PO CAPS: 200.0000 mg | ORAL_CAPSULE | Freq: Three times a day (TID) | ORAL | Status: DC | PRN

## 2013-09-13 MED ORDER — DULOXETINE HCL 60 MG PO CPEP: 60.0000 mg | ORAL_CAPSULE | Freq: Every day | ORAL | Status: DC

## 2013-09-13 NOTE — Progress Notes (Signed)
Subjective:    Patient ID: Theresa Lamb, female    DOB: February 14, 1951, 63 y.o.   MRN: [id number]  HPI  Theresa Auerbachrish is here today to discuss the conditions listed below and to get medication refills.  1)  Hypertension:  She is taking Maxzide and Bystolic.  2)  GERD:  She is taking Omeprazole 20mg  and is doing well on it.  3)  Myalgia:  She is taking Cymbalta and does well on it.   4)  Allergies:  She is taking Zyrtec and Nasonex.   Review of Systems  Constitutional: Negative for activity change, appetite change, fatigue and unexpected weight change.  HENT: Negative.   Eyes: Negative.   Respiratory: Negative for shortness of breath.   Cardiovascular: Negative for chest pain, palpitations and leg swelling.  Gastrointestinal: Negative for diarrhea and constipation.  Endocrine: Negative.   Genitourinary: Negative for difficulty urinating.  Musculoskeletal: Negative.   Skin: Negative.   Neurological: Negative.   Hematological: Negative for adenopathy. Does not bruise/bleed easily.  Psychiatric/Behavioral: Negative for sleep disturbance and dysphoric mood. The patient is not nervous/anxious.   All other systems reviewed and are negative.    Past Medical History  Diagnosis Date  . Hypertension   . Depression   . Anxiety   . Ovarian cyst   . GERD (gastroesophageal reflux disease)   . Colon polyp   . Cough      Past Surgical History  Procedure Laterality Date  . Total abdominal hysterectomy  1982  . Cervical disc surgery  2004  . Breast biopsy  1982  . 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)    Education:  Engineer, agriculturalHigh School Graduate   Tobacco Use/Exposure: She quit smoking in 1998 after 20 years of 1/2 to 1 ppd   Alcohol Use:  Rarely   Drug Use:  None   Diet:  Weight Watchers   Exercise:  None   Hobbies:  Knitting              Family History  Problem Relation Age of Onset  . Diabetes Mother   . Heart failure Mother   . Heart disease Mother   . Cancer Mother     Uterine Cancer/ Bladder Cancer  . Heart disease Father   . Sarcoidosis Sister   . Heart disease Brother   . Diabetes Maternal Aunt   . Diabetes Maternal Uncle   . Diabetes Maternal Grandmother   . Melanoma Maternal Grandmother      Current Outpatient Prescriptions on File Prior to Visit  Medication Sig Dispense Refill  . benzonatate (TESSALON) 100 MG capsule Take 2 capsules (200 mg total) by mouth 3 (three) times daily as needed for cough.  180 capsule  6  . cetirizine (ZYRTEC) 10 MG tablet Take 5 mg by mouth daily.       . cholecalciferol (VITAMIN D) 1000 UNITS tablet Take 1,000 Units by mouth daily.      . cyclobenzaprine (FLEXERIL) 10 MG tablet Take 10 mg by mouth 3 (three) times daily as needed for muscle spasms.      . mometasone (NASONEX) 50 MCG/ACT nasal spray Place 2 sprays into the nose 2 (two) times daily.  51 g  1  . omeprazole (PRILOSEC) 40 MG capsule Take 20 mg by mouth daily.  No current facility-administered medications on file prior to visit.     Allergies  Allergen Reactions  . Morphine And Related     itching     Immunization History  Administered Date(s) Administered  . Influenza Split 03/04/2012, 04/15/2013       Objective:   Physical Exam  Nursing note and vitals reviewed. Constitutional: She appears well-nourished. No distress.  HENT:  Head: Normocephalic.  Eyes: Pupils are equal, round, and reactive to light. No scleral icterus.  Neck: Normal range of motion. No thyromegaly present.  Cardiovascular: Normal rate, regular rhythm and normal heart sounds.   Pulmonary/Chest: Effort normal and breath sounds normal.  Abdominal: There is no tenderness.  Musculoskeletal: She exhibits no edema and no tenderness.  Neurological: She is alert.  Skin: Skin is warm and dry.  Psychiatric: She has a  normal mood and affect. Her behavior is normal. Judgment and thought content normal.      Assessment & Plan:   Janete was seen today for medication management.  Diagnoses and associated orders for this visit:  Essential hypertension, benign - triamterene-hydrochlorothiazide (MAXZIDE-25) 37.5-25 MG per tablet; Take 1 tablet by mouth daily. - nebivolol (BYSTOLIC) 5 MG tablet; Take 1 tablet (5 mg total) by mouth daily. - telmisartan (MICARDIS) 80 MG tablet; Take 1 tablet (80 mg total) by mouth daily.  Seasonal allergies  GERD (gastroesophageal reflux disease)  Menopause - Estradiol (VAGIFEM) 10 MCG TABS vaginal tablet; Place 1 tablet (10 mcg total) vaginally 3 (three) times a week.  Myalgia and myositis - DULoxetine (CYMBALTA) 60 MG capsule; Take 1 capsule (60 mg total) by mouth daily.  Cough - benzonatate (TESSALON) 200 MG capsule; Take 1 capsule (200 mg total) by mouth 3 (three) times daily as needed for cough.  Osteoarthritis - diclofenac sodium (VOLTAREN) 1 % GEL; Apply 4 g topically 4 (four) times daily. You may apply up to 2 areas of body (Max 32 grams in 24 hours)   TIME SPENT "FACE TO FACE" WITH PATIENT -  30 MINS

## 2013-09-13 NOTE — Patient Instructions (Signed)
1)  Hypertension - Stop the Maxzide and add Micardis 80 mg in the morning and take the Bystolic in the evening.     DASH Diet The DASH diet stands for "Dietary Approaches to Stop Hypertension." It is a healthy eating plan that has been shown to reduce high blood pressure (hypertension) in as little as 14 days, while also possibly providing other significant health benefits. These other health benefits include reducing the risk of breast cancer after menopause and reducing the risk of type 2 diabetes, heart disease, colon cancer, and stroke. Health benefits also include weight loss and slowing kidney failure in patients with chronic kidney disease.  DIET GUIDELINES  Limit salt (sodium). Your diet should contain less than 1500 mg of sodium daily.  Limit refined or processed carbohydrates. Your diet should include mostly whole grains. Desserts and added sugars should be used sparingly.  Include small amounts of heart-healthy fats. These types of fats include nuts, oils, and tub margarine. Limit saturated and trans fats. These fats have been shown to be harmful in the body. CHOOSING FOODS  The following food groups are based on a 2000 calorie diet. See your Registered Dietitian for individual calorie needs. Grains and Grain Products (6 to 8 servings daily)  Eat More Often: Whole-wheat bread, brown rice, whole-grain or wheat pasta, quinoa, popcorn without added fat or salt (air popped).  Eat Less Often: White bread, white pasta, white rice, cornbread. Vegetables (4 to 5 servings daily)  Eat More Often: Fresh, frozen, and canned vegetables. Vegetables may be raw, steamed, roasted, or grilled with a minimal amount of fat.  Eat Less Often/Avoid: Creamed or fried vegetables. Vegetables in a cheese sauce. Fruit (4 to 5 servings daily)  Eat More Often: All fresh, canned (in natural juice), or frozen fruits. Dried fruits without added sugar. One hundred percent fruit juice ( cup [237 mL]  daily).  Eat Less Often: Dried fruits with added sugar. Canned fruit in light or heavy syrup. Foot LockerLean Meats, Fish, and Poultry (2 servings or less daily. One serving is 3 to 4 oz [85-114 g]).  Eat More Often: Ninety percent or leaner ground beef, tenderloin, sirloin. Round cuts of beef, chicken breast, [place]key breast. All fish. Grill, bake, or broil your meat. Nothing should be fried.  Eat Less Often/Avoid: Fatty cuts of meat, [place]key, or chicken leg, thigh, or wing. Fried cuts of meat or fish. Dairy (2 to 3 servings)  Eat More Often: Low-fat or fat-free milk, low-fat plain or light yogurt, reduced-fat or part-skim cheese.  Eat Less Often/Avoid: Milk (whole, 2%).Whole milk yogurt. Full-fat cheeses. Nuts, Seeds, and Legumes (4 to 5 servings per week)  Eat More Often: All without added salt.  Eat Less Often/Avoid: Salted nuts and seeds, canned beans with added salt. Fats and Sweets (limited)  Eat More Often: Vegetable oils, tub margarines without trans fats, sugar-free gelatin. Mayonnaise and salad dressings.  Eat Less Often/Avoid: Coconut oils, palm oils, butter, stick margarine, cream, half and half, cookies, candy, pie. FOR MORE INFORMATION The Dash Diet Eating Plan: www.dashdiet.org Document Released: 06/09/2011 Document Revised: 09/12/2011 Document Reviewed: 06/09/2011 [facility] Patient Information 2014 [employer], [place].

## 2013-10-22 ENCOUNTER — Ambulatory Visit: Payer: Commercial Managed Care - PPO | Admitting: Pulmonary Disease

## 2013-10-24 ENCOUNTER — Other Ambulatory Visit: Payer: Commercial Managed Care - PPO

## 2013-10-30 ENCOUNTER — Other Ambulatory Visit: Payer: Self-pay | Admitting: *Deleted

## 2013-10-30 DIAGNOSIS — R5381 Other malaise: Secondary | ICD-10-CM

## 2013-10-30 DIAGNOSIS — E785 Hyperlipidemia, unspecified: Secondary | ICD-10-CM

## 2013-10-30 DIAGNOSIS — R5383 Other fatigue: Principal | ICD-10-CM

## 2013-11-01 ENCOUNTER — Ambulatory Visit: Payer: Commercial Managed Care - PPO | Admitting: Family Medicine

## 2013-11-18 ENCOUNTER — Other Ambulatory Visit: Payer: Commercial Managed Care - PPO

## 2013-11-26 ENCOUNTER — Ambulatory Visit: Payer: Commercial Managed Care - PPO | Admitting: Family Medicine

## 2013-12-03 ENCOUNTER — Other Ambulatory Visit: Payer: Commercial Managed Care - PPO

## 2013-12-10 ENCOUNTER — Ambulatory Visit: Payer: Commercial Managed Care - PPO | Admitting: Family Medicine

## 2014-01-30 ENCOUNTER — Other Ambulatory Visit: Payer: Self-pay | Admitting: *Deleted

## 2014-01-30 DIAGNOSIS — R5381 Other malaise: Secondary | ICD-10-CM

## 2014-01-30 DIAGNOSIS — E785 Hyperlipidemia, unspecified: Secondary | ICD-10-CM

## 2014-01-30 DIAGNOSIS — R5383 Other fatigue: Principal | ICD-10-CM

## 2014-01-31 ENCOUNTER — Other Ambulatory Visit: Payer: Commercial Managed Care - PPO

## 2014-01-31 LAB — LIPID PANEL
Cholesterol: 191 mg/dL (ref 0–200)
HDL: 46 mg/dL (ref >39)
LDL Cholesterol: 127 mg/dL — ABNORMAL HIGH (ref 0–99)
Total CHOL/HDL Ratio: 4.2 Ratio
Triglycerides: 89 mg/dL (ref <150)
VLDL: 18 mg/dL (ref 0–40)

## 2014-01-31 LAB — CBC WITH DIFFERENTIAL/PLATELET
Basophils Absolute: 0.1 10*3/uL (ref 0.0–0.1)
Basophils Relative: 1 % (ref 0–1)
Eosinophils Absolute: 0.1 10*3/uL (ref 0.0–0.7)
Eosinophils Relative: 2 % (ref 0–5)
HCT: 45.4 % (ref 36.0–46.0)
Hemoglobin: 15.5 g/dL — ABNORMAL HIGH (ref 12.0–15.0)
Lymphocytes Relative: 38 % (ref 12–46)
Lymphs Abs: 2.2 10*3/uL (ref 0.7–4.0)
MCH: 29 pg (ref 26.0–34.0)
MCHC: 34.1 g/dL (ref 30.0–36.0)
MCV: 85 fL (ref 78.0–100.0)
Monocytes Absolute: 0.4 10*3/uL (ref 0.1–1.0)
Monocytes Relative: 7 % (ref 3–12)
Neutro Abs: 3 10*3/uL (ref 1.7–7.7)
Neutrophils Relative %: 52 % (ref 43–77)
Platelets: 138 10*3/uL — ABNORMAL LOW (ref 150–400)
RBC: 5.34 MIL/uL — ABNORMAL HIGH (ref 3.87–5.11)
RDW: 13.6 % (ref 11.5–15.5)
WBC: 5.8 10*3/uL (ref 4.0–10.5)

## 2014-01-31 LAB — COMPLETE METABOLIC PANEL WITH GFR
ALT: 42 U/L — ABNORMAL HIGH (ref 0–35)
AST: 40 U/L — ABNORMAL HIGH (ref 0–37)
Albumin: 4.2 g/dL (ref 3.5–5.2)
Alkaline Phosphatase: 61 U/L (ref 39–117)
BUN: 14 mg/dL (ref 6–23)
CO2: 31 mEq/L (ref 19–32)
Calcium: 9.6 mg/dL (ref 8.4–10.5)
Chloride: 99 mEq/L (ref 96–112)
Creat: 0.84 mg/dL (ref 0.50–1.10)
GFR, Est African American: 86 mL/min
GFR, Est Non African American: 75 mL/min
Glucose, Bld: 111 mg/dL — ABNORMAL HIGH (ref 70–99)
Potassium: 4.2 mEq/L (ref 3.5–5.3)
Sodium: 138 mEq/L (ref 135–145)
Total Bilirubin: 0.9 mg/dL (ref 0.2–1.2)
Total Protein: 6.9 g/dL (ref 6.0–8.3)

## 2014-02-01 LAB — TSH: TSH: 0.495 u[IU]/mL (ref 0.350–4.500)

## 2014-02-06 ENCOUNTER — Ambulatory Visit (INDEPENDENT_AMBULATORY_CARE_PROVIDER_SITE_OTHER): Payer: Commercial Managed Care - PPO | Admitting: Family Medicine

## 2014-02-06 ENCOUNTER — Encounter: Payer: Self-pay | Admitting: Family Medicine

## 2014-02-06 VITALS — BP 148/79 | HR 70 | Resp 16 | Ht 60.25 in | Wt 196.0 lb

## 2014-02-06 DIAGNOSIS — F411 Generalized anxiety disorder: Secondary | ICD-10-CM

## 2014-02-06 DIAGNOSIS — R7301 Impaired fasting glucose: Secondary | ICD-10-CM

## 2014-02-06 DIAGNOSIS — I10 Essential (primary) hypertension: Secondary | ICD-10-CM

## 2014-02-06 DIAGNOSIS — K219 Gastro-esophageal reflux disease without esophagitis: Secondary | ICD-10-CM

## 2014-02-06 DIAGNOSIS — IMO0001 Reserved for inherently not codable concepts without codable children: Secondary | ICD-10-CM

## 2014-02-06 MED ORDER — DULOXETINE HCL 60 MG PO CPEP: 60.0000 mg | ORAL_CAPSULE | Freq: Every day | ORAL | Status: AC

## 2014-02-06 MED ORDER — ALBIGLUTIDE 50 MG ~~LOC~~ PEN: 1.0000 "pen " | PEN_INJECTOR | SUBCUTANEOUS | Status: DC

## 2014-02-06 MED ORDER — OMEPRAZOLE 40 MG PO CPDR: 40.0000 mg | DELAYED_RELEASE_CAPSULE | Freq: Every day | ORAL | Status: DC

## 2014-02-06 NOTE — Progress Notes (Signed)
Subjective:    Patient ID: Theresa Lamb, female    DOB: 12-05-1950, 63 y.o.   MRN: [id number]  HPI  Theresa Lamb is here today to follow up on her recent lab results. She is needing to get medication refills. We are going over the following conditions:  1)  Hypertension:  She is doing well on Maxzide and Bystolic.   2)  GERD:  She is doing well on the omeprazole.   3)  Anxiety:  She is doing well on the Cymbalta.    Review of Systems  Constitutional: Negative for activity change, appetite change and fatigue.  Cardiovascular: Negative for chest pain, palpitations and leg swelling.  Psychiatric/Behavioral: Negative for behavioral problems. The patient is not nervous/anxious.   All other systems reviewed and are negative.    Past Medical History  Diagnosis Date  . Hypertension   . Depression   . Anxiety   . Ovarian cyst   . GERD (gastroesophageal reflux disease)   . Colon polyp   . Cough      Past Surgical History  Procedure Laterality Date  . Total abdominal hysterectomy  1982  . Cervical disc surgery  2004  . Breast biopsy  1982  . 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)    Education:  Engineer, agricultural   Tobacco Use/Exposure: She quit smoking in 1998 after 20 years of 1/2 to 1 ppd   Alcohol Use:  Rarely   Drug Use:  None   Diet:  Weight Watchers   Exercise:  None   Hobbies:  Knitting              Family History  Problem Relation Age of Onset  . Diabetes Mother   . Heart failure Mother   . Heart disease Mother   . Cancer Mother     Uterine Cancer/ Bladder Cancer  . Heart disease Father   . Sarcoidosis Sister   . Heart disease Brother   . Diabetes Maternal Aunt   . Diabetes Maternal Uncle   . Diabetes Maternal Grandmother   . Melanoma Maternal Grandmother      Current Outpatient  Prescriptions on File Prior to Visit  Medication Sig Dispense Refill  . cetirizine (ZYRTEC) 10 MG tablet Take 5 mg by mouth daily.       . cholecalciferol (VITAMIN D) 1000 UNITS tablet Take 1,000 Units by mouth daily.      . cyclobenzaprine (FLEXERIL) 10 MG tablet Take 10 mg by mouth 3 (three) times daily as needed for muscle spasms.      . Estradiol (VAGIFEM) 10 MCG TABS vaginal tablet Place 1 tablet (10 mcg total) vaginally 3 (three) times a week.  12 tablet  11  . mometasone (NASONEX) 50 MCG/ACT nasal spray Place 2 sprays into the nose 2 (two) times daily.  51 g  1  . nebivolol (BYSTOLIC) 5 MG tablet Take 1 tablet (5 mg total) by mouth daily.  30 tablet  11  . triamterene-hydrochlorothiazide (MAXZIDE-25) 37.5-25 MG per tablet Take 1 tablet by mouth daily.  30 tablet  11   No current facility-administered medications on file prior to visit.     Allergies  Allergen Reactions  . Morphine And Related     itching     Immunization History  Administered  Date(s) Administered  . Influenza Split 03/04/2012, 04/15/2013       Objective:   Physical Exam  Nursing note and vitals reviewed. Constitutional: She appears well-nourished. No distress.  HENT:  Head: Normocephalic.  Eyes: Pupils are equal, round, and reactive to light. No scleral icterus.  Neck: Normal range of motion. No thyromegaly present.  Cardiovascular: Normal rate, regular rhythm and normal heart sounds.   Pulmonary/Chest: Effort normal and breath sounds normal.  Abdominal: There is no tenderness.  Musculoskeletal: She exhibits no edema and no tenderness.  Neurological: She is alert.  Skin: Skin is warm and dry.  Psychiatric: She has a normal mood and affect. Her behavior is normal. Judgment and thought content normal.      Assessment & Plan:    Theresa Lamb was seen today for medication management and medical management of chronic issues.  Diagnoses and associated orders for this visit:  Essential hypertension,  benign Comments: Contnrolled  Gastroesophageal reflux disease without esophagitis - omeprazole (PRILOSEC) 40 MG capsule; Take 1 capsule (40 mg total) by mouth daily.  Anxiety state, unspecified - DULoxetine (CYMBALTA) 60 MG capsule; Take 1 capsule (60 mg total) by mouth daily.  Myalgia and myositis - DULoxetine (CYMBALTA) 60 MG capsule; Take 1 capsule (60 mg total) by mouth daily.  IFG (impaired fasting glucose) - Albiglutide (TANZEUM) 50 MG PEN; Inject 1 pen into the skin once a week.   TIME SPENT "FACE TO FACE" WITH PATIENT -  30 MINS

## 2014-05-05 IMAGING — CT CT PARANASAL SINUSES LIMITED
1 series · 9 of 11 positions shown, 12 images · non-contrast
Comparison: None.

CLINICAL DATA: Recurrent sinusitis.

CT PARANASAL SINUS LIMITED WITHOUT CONTRAST
TECHNIQUE: Multidetector CT images of the paranasal sinuses were
obtained in a single plane without contrast.

[Series 3: sinus prone 5.0 h31s · axial · 0.32mm/px · z∈[-183,-99]mm · 9 of 11 slices shown, 12 images]
[im 2/11  brain]
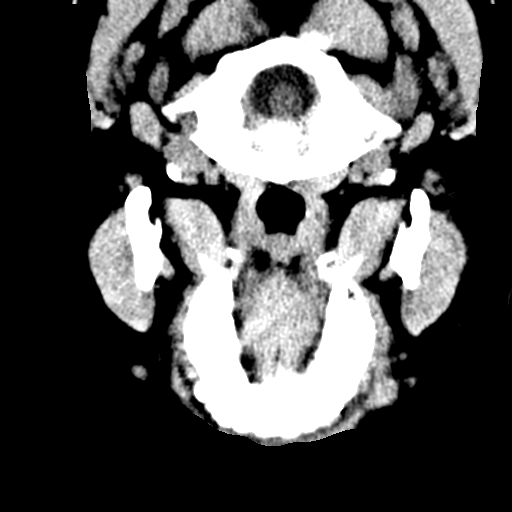
[im 2/11  bone]
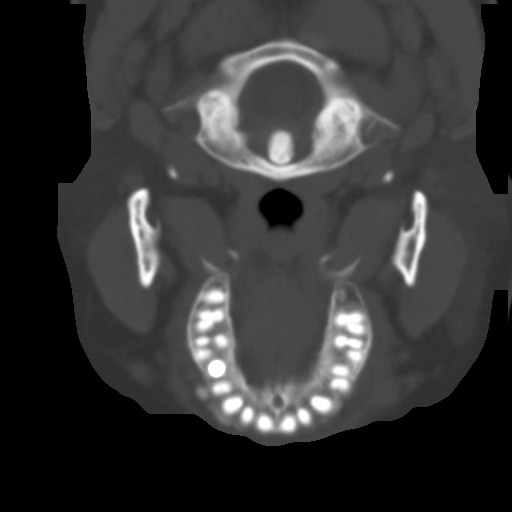
[im 3/11  bone]
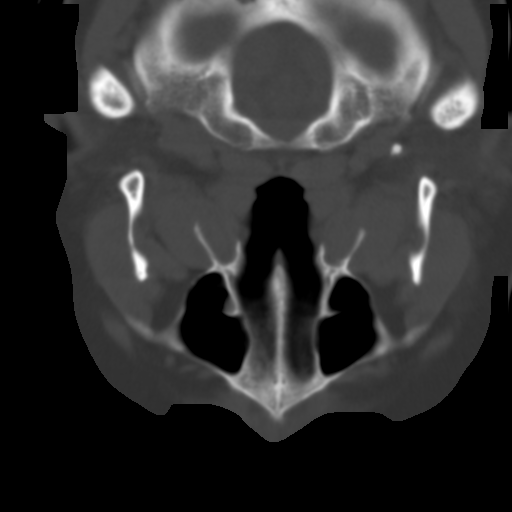
[im 4/11  bone]
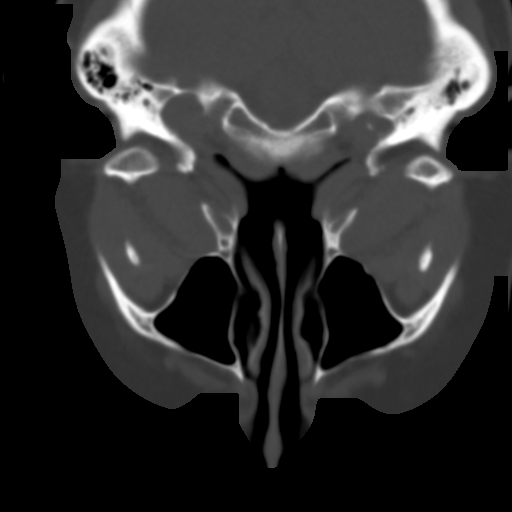
[im 5/11  bone]
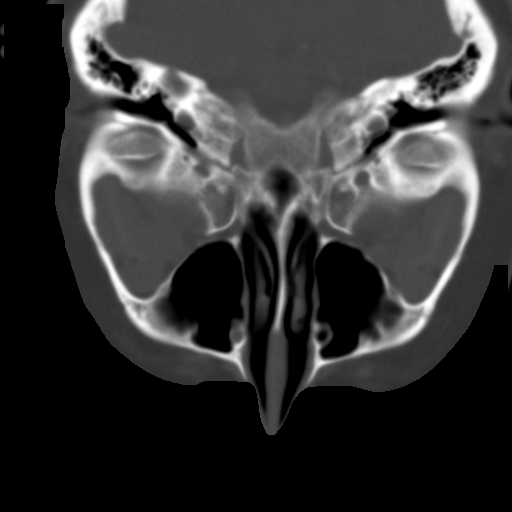
[im 6/11  brain]
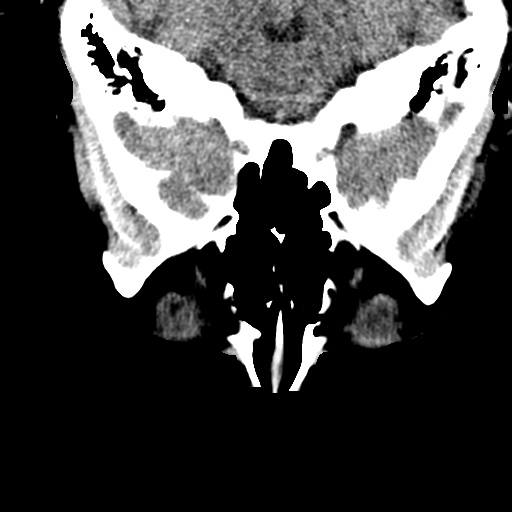
[im 6/11  bone]
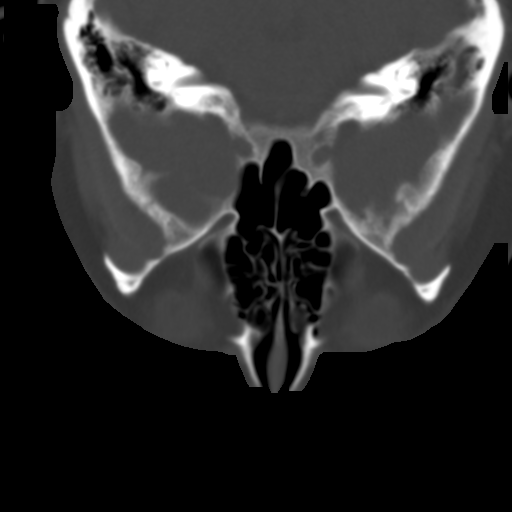
[im 7/11  bone]
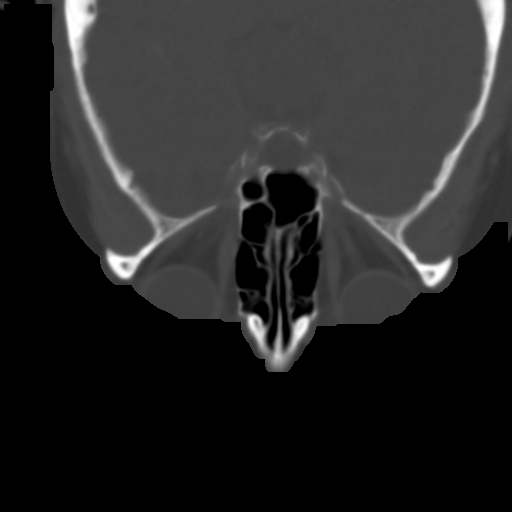
[im 8/11  bone]
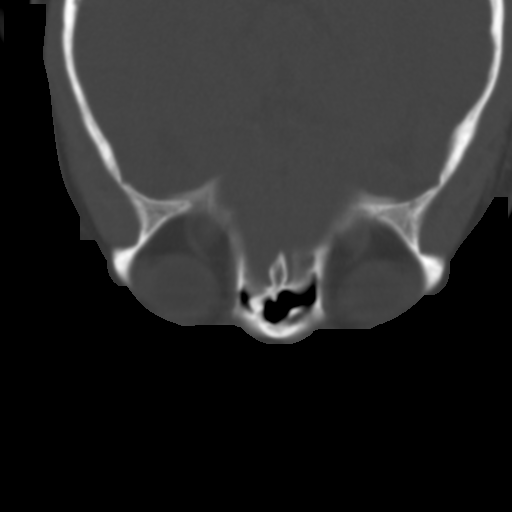
[im 9/11  bone]
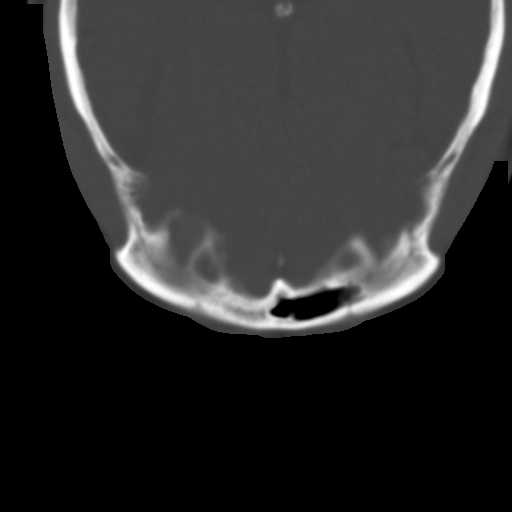
[im 10/11  brain]
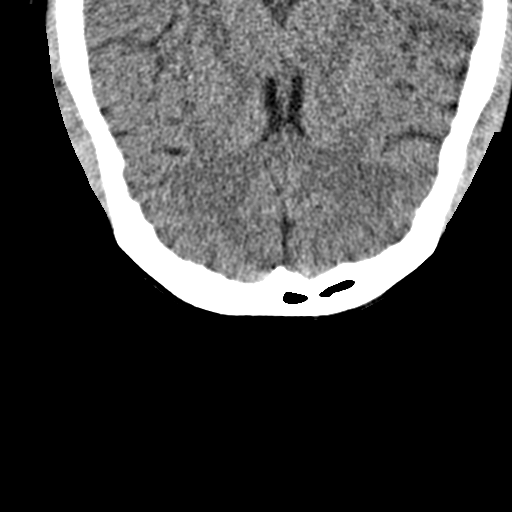
[im 10/11  bone]
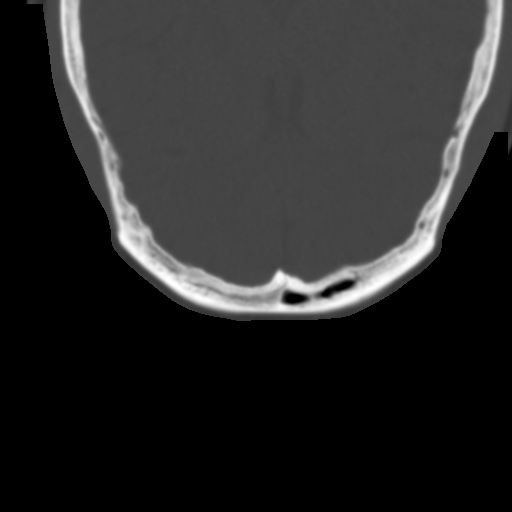

[9 of 11 positions shown; findings below may reference images not displayed]

FINDINGS: The maxillary sinuses are clear.  Sphenoid sinuses and
visualized mastoid air cells clear.  Frontal sinuses and ethmoid
air cells clear.  Grossly, intracranial contents are grossly within
normal limits.
IMPRESSION: Clear paranasal sinuses.

## 2014-05-26 ENCOUNTER — Ambulatory Visit (INDEPENDENT_AMBULATORY_CARE_PROVIDER_SITE_OTHER): Payer: Commercial Managed Care - PPO | Admitting: Critical Care Medicine

## 2014-05-26 ENCOUNTER — Encounter: Payer: Self-pay | Admitting: Critical Care Medicine

## 2014-05-26 VITALS — BP 130/70 | HR 76 | Temp 98.5°F | Ht 61.0 in | Wt 200.4 lb

## 2014-05-26 DIAGNOSIS — R05 Cough: Secondary | ICD-10-CM

## 2014-05-26 DIAGNOSIS — R059 Cough, unspecified: Secondary | ICD-10-CM

## 2014-05-26 MED ORDER — FAMOTIDINE 20 MG PO TABS: 20.0000 mg | ORAL_TABLET | Freq: Every day | ORAL | Status: DC

## 2014-05-26 MED ORDER — DEXTROMETHORPHAN POLISTIREX 30 MG/5ML PO LQCR: 30.0000 mg | ORAL | Status: DC | PRN

## 2014-05-26 MED ORDER — AMOXICILLIN-POT CLAVULANATE 875-125 MG PO TABS: 1.0000 | ORAL_TABLET | Freq: Two times a day (BID) | ORAL | Status: DC

## 2014-05-26 MED ORDER — METHYLPREDNISOLONE ACETATE 80 MG/ML IJ SUSP
120.0000 mg | Freq: Once | INTRAMUSCULAR | Status: AC
  Administered 2014-05-26: 120 mg via INTRAMUSCULAR

## 2014-05-26 MED ORDER — BENZONATATE 100 MG PO CAPS: ORAL_CAPSULE | ORAL | Status: DC

## 2014-05-26 NOTE — Assessment & Plan Note (Signed)
Cyclical cough with acute sinusitis GERD ppt factor Plan Pepcid 20mg  at bedtime Stay on omeprazole daily Take augmentin one twice daily for 7days Resume nasonex two puff daily ea nostril Stay on zyrtec daily Strict reflux diet, no sodas Cough protocol with delsym/benzonatate A depomedrol 120mg  injection was given Return 1 month

## 2014-05-26 NOTE — Patient Instructions (Addendum)
Pepcid 20mg  at bedtime Stay on omeprazole daily Take augmentin one twice daily for 7days Resume nasonex two puff daily ea nostril Stay on zyrtec daily Strict reflux diet, no sodas Cough protocol with delsym/benzonatate A depomedrol 120mg  injection was given Return 1 month

## 2014-05-26 NOTE — Progress Notes (Signed)
Subjective:    Patient ID: Theresa Monsatricia Theresa Lamb, female    DOB: October 10, 1950, 63 y.o.   MRN: [id number]  HPI Comments: Chronic cough since November 2013.  Essentially dry cough.  Pt saw GI and ENT and dx reflux. Rx for same and helped some    05/26/2014 Chief Complaint  Patient presents with  . Follow-up    F/U cough x 2 years; dry cough worsened over the weekend, pt states she had fever; chest congestion   Worse over past 3 days., coughing more, no real mucus.  Notes mild wheeze in upper airway. No pndrip.  Pt had T 101 overnight.   No sinus pressure.  No GERD or indigestion   Throat is sore, No nasal d/c.  No belching or burping.  No dysphagia.     Review of Systems  Constitutional: Positive for fever and fatigue. Negative for diaphoresis, activity change, appetite change and unexpected weight change.  HENT: Positive for congestion, ear pain, postnasal drip, sinus pressure, sore throat and voice change. Negative for dental problem, ear discharge, facial swelling, hearing loss, mouth sores, nosebleeds, rhinorrhea, sneezing, tinnitus and trouble swallowing.   Eyes: Negative for photophobia, discharge, itching and visual disturbance.  Respiratory: Positive for cough, shortness of breath and wheezing. Negative for apnea, choking, chest tightness and stridor.   Cardiovascular: Positive for leg swelling. Negative for palpitations.  Gastrointestinal: Negative for nausea, vomiting, abdominal pain, constipation, blood in stool and abdominal distention.  Genitourinary: Negative for dysuria, urgency, frequency, hematuria, flank pain, decreased urine volume and difficulty urinating.  Musculoskeletal: Negative for back pain, joint swelling, arthralgias, gait problem, neck pain and neck stiffness.  Skin: Negative for color change and pallor.  Neurological: Negative for dizziness, tremors, seizures, syncope, speech difficulty, weakness, light-headedness and numbness.  Hematological: Negative for  adenopathy. Does not bruise/bleed easily.  Psychiatric/Behavioral: Negative for confusion, sleep disturbance and agitation. The patient is not nervous/anxious.        Objective:   Physical Exam Filed Vitals:   05/26/14 1155  BP: 130/70  Pulse: 76  Temp: 98.5 F (36.9 C)  Height: 5\' 1"  (1.549 m)  Weight: 200 lb 6.4 oz (90.901 kg)  SpO2: 95%    Gen: Pleasant, well-nourished, in no distress,  normal affect  ENT: No lesions,  mouth clear,  oropharynx clear, 3+ postnasal drip   Nasal purulence   Neck: No JVD, no TMG, no carotid bruits  Lungs: No use of accessory muscles, no dullness to percussion, prominent pseudowheeze  Cardiovascular: RRR, heart sounds normal, no murmur or gallops, no peripheral edema  Abdomen: soft and NT, no HSM,  BS normal  Musculoskeletal: No deformities, no cyanosis or clubbing  Neuro: alert, non focal  Skin: Warm, no lesions or rashes  No results found.    Assessment & Plan:   Cough Cyclical cough with acute sinusitis GERD ppt factor Plan Pepcid 20mg  at bedtime Stay on omeprazole daily Take augmentin one twice daily for 7days Resume nasonex two puff daily ea nostril Stay on zyrtec daily Strict reflux diet, no sodas Cough protocol with delsym/benzonatate A depomedrol 120mg  injection was given Return 1 month     Updated Medication List Outpatient Encounter Prescriptions as of 05/26/2014  Medication Sig  . cetirizine (ZYRTEC) 10 MG tablet Take 5 mg by mouth daily.   . cholecalciferol (VITAMIN D) 1000 UNITS tablet Take 1,000 Units by mouth daily.  . cyclobenzaprine (FLEXERIL) 10 MG tablet Take 10 mg by mouth 3 (three) times daily as needed for muscle spasms.  .Marland Kitchen  DULoxetine (CYMBALTA) 60 MG capsule Take 1 capsule (60 mg total) by mouth daily.  . mometasone (NASONEX) 50 MCG/ACT nasal spray Place 2 sprays into the nose 2 (two) times daily.  . nebivolol (BYSTOLIC) 5 MG tablet Take 1 tablet (5 mg total) by mouth daily.  Marland Kitchen. omeprazole  (PRILOSEC) 40 MG capsule Take 1 capsule (40 mg total) by mouth daily.  Marland Kitchen. triamterene-hydrochlorothiazide (MAXZIDE-25) 37.5-25 MG per tablet Take 1 tablet by mouth daily.  Marland Kitchen. amoxicillin-clavulanate (AUGMENTIN) 875-125 MG per tablet Take 1 tablet by mouth 2 (two) times daily.  . benzonatate (TESSALON) 100 MG capsule Use 1-2 every 6 hours as needed for cough  . dextromethorphan (DELSYM) 30 MG/5ML liquid Take 5 mLs (30 mg total) by mouth as needed for cough.  . famotidine (PEPCID) 20 MG tablet Take 1 tablet (20 mg total) by mouth at bedtime.  . [DISCONTINUED] Albiglutide (TANZEUM) 50 MG PEN Inject 1 pen into the skin once a week. (Patient not taking: Reported on 05/26/2014)  . [DISCONTINUED] Estradiol (VAGIFEM) 10 MCG TABS vaginal tablet Place 1 tablet (10 mcg total) vaginally 3 (three) times a week. (Patient not taking: Reported on 05/26/2014)  . [EXPIRED] methylPREDNISolone acetate (DEPO-MEDROL) injection 120 mg

## 2014-05-28 ENCOUNTER — Telehealth: Payer: Self-pay | Admitting: Critical Care Medicine

## 2014-05-28 MED ORDER — CEFUROXIME AXETIL 500 MG PO TABS: 500.0000 mg | ORAL_TABLET | Freq: Two times a day (BID) | ORAL | Status: DC

## 2014-05-28 NOTE — Telephone Encounter (Signed)
Place allergy to augmentin on med list Call in ceftin 500mg  bid x 5 days generic ok

## 2014-05-28 NOTE — Telephone Encounter (Signed)
Patient has severe cough.  Patient was given Augmentin, she has been up all night and all morning with watery diarrhea. Stomach is rumbling.  She says that she doesn't want to take it anymore.  Patient would like an alternative antibiotic.

## 2014-05-28 NOTE — Telephone Encounter (Signed)
Augmentin placed on allergy list.  Ceftin sent to pharmacy.  Patient notified.  Nothing further needed.

## 2014-06-12 ENCOUNTER — Ambulatory Visit: Payer: Commercial Managed Care - PPO | Admitting: Critical Care Medicine

## 2014-07-17 ENCOUNTER — Ambulatory Visit (INDEPENDENT_AMBULATORY_CARE_PROVIDER_SITE_OTHER): Payer: Commercial Managed Care - PPO | Admitting: Critical Care Medicine

## 2014-07-17 ENCOUNTER — Encounter: Payer: Self-pay | Admitting: Critical Care Medicine

## 2014-07-17 VITALS — BP 130/80 | HR 69 | Temp 98.2°F | Ht 61.0 in | Wt 196.0 lb

## 2014-07-17 DIAGNOSIS — K219 Gastro-esophageal reflux disease without esophagitis: Secondary | ICD-10-CM

## 2014-07-17 DIAGNOSIS — R05 Cough: Secondary | ICD-10-CM

## 2014-07-17 DIAGNOSIS — R059 Cough, unspecified: Secondary | ICD-10-CM

## 2014-07-17 MED ORDER — CETIRIZINE HCL 10 MG PO TABS: 10.0000 mg | ORAL_TABLET | Freq: Every day | ORAL | Status: DC

## 2014-07-17 MED ORDER — OMEPRAZOLE 40 MG PO CPDR: 40.0000 mg | DELAYED_RELEASE_CAPSULE | Freq: Every day | ORAL | Status: AC

## 2014-07-17 NOTE — Progress Notes (Signed)
Subjective:    Patient ID: Theresa Lamb, female    DOB: 1951/03/05, 64 y.o.   MRN: [id number]  HPI Comments: Chronic cough since November 2013.  Essentially dry cough.  Pt saw GI and ENT and dx reflux. Rx for same and helped some       07/17/2014 Chief Complaint  Patient presents with  . Follow-up    cough; better than it was at last OV, but still lingering.    Cough is better but still persists.  Still clears the throat.  Pt still with postnasal drip.  Pt remains hoarse.  Pt notes some headache as well. Pt is not on chlor trimeton now , dried out too much.   Review of Systems  Constitutional: Negative for fever, diaphoresis, activity change, appetite change, fatigue and unexpected weight change.  HENT: Positive for postnasal drip, sinus pressure and voice change. Negative for congestion, dental problem, ear discharge, ear pain, facial swelling, hearing loss, mouth sores, nosebleeds, rhinorrhea, sneezing, sore throat, tinnitus and trouble swallowing.   Eyes: Negative for photophobia, discharge, itching and visual disturbance.  Respiratory: Positive for cough. Negative for apnea, choking, chest tightness, shortness of breath, wheezing and stridor.   Cardiovascular: Negative for palpitations and leg swelling.  Gastrointestinal: Negative for nausea, vomiting, abdominal pain, constipation, blood in stool and abdominal distention.  Genitourinary: Negative for dysuria, urgency, frequency, hematuria, flank pain, decreased urine volume and difficulty urinating.  Musculoskeletal: Negative for back pain, joint swelling, arthralgias, gait problem, neck pain and neck stiffness.  Skin: Negative for color change and pallor.  Neurological: Negative for dizziness, tremors, seizures, syncope, speech difficulty, weakness, light-headedness and numbness.  Hematological: Negative for adenopathy. Does not bruise/bleed easily.  Psychiatric/Behavioral: Negative for confusion, sleep disturbance and  agitation. The patient is not nervous/anxious.        Objective:   Physical Exam Filed Vitals:   07/17/14 0911  BP: 130/80  Pulse: 69  Temp: 98.2 F (36.8 C)  TempSrc: Oral  Height:  (1.549 m)  Weight: 196 lb (88.905 kg)  SpO2: 96%    Gen: Pleasant, well-nourished, in no distress,  normal affect  ENT: No lesions,  mouth clear,  oropharynx clear, 3+ postnasal drip   Nasal purulence   Neck: No JVD, no TMG, no carotid bruits  Lungs: No use of accessory muscles, no dullness to percussion, prominent pseudowheeze  Cardiovascular: RRR, heart sounds normal, no murmur or gallops, no peripheral edema  Abdomen: soft and NT, no HSM,  BS normal  Musculoskeletal: No deformities, no cyanosis or clubbing  Neuro: alert, non focal  Skin: Warm, no lesions or rashes  No results found.    Assessment & Plan:   No problem-specific assessment & plan notes found for this encounter.   Updated Medication List Outpatient Encounter Prescriptions as of 07/17/2014  Medication Sig  . benzonatate (TESSALON) 100 MG capsule Use 1-2 every 6 hours as needed for cough  . cetirizine (ZYRTEC) 10 MG tablet Take 5 mg by mouth daily.   . cholecalciferol (VITAMIN D) 1000 UNITS tablet Take 1,000 Units by mouth daily.  . cyclobenzaprine (FLEXERIL) 10 MG tablet Take 10 mg by mouth 3 (three) times daily as needed for muscle spasms.  . DULoxetine (CYMBALTA) 60 MG capsule Take 1 capsule (60 mg total) by mouth daily.  . mometasone (NASONEX) 50 MCG/ACT nasal spray Place 2 sprays into the nose 2 (two) times daily.  . nebivolol (BYSTOLIC) 5 MG tablet Take 1 tablet (5 mg total) by mouth daily.  Marland Kitchen  omeprazole (PRILOSEC) 40 MG capsule Take 1 capsule (40 mg total) by mouth daily.  Marland Kitchen. triamterene-hydrochlorothiazide (MAXZIDE-25) 37.5-25 MG per tablet Take 1 tablet by mouth daily.  . [DISCONTINUED] cefUROXime (CEFTIN) 500 MG tablet Take 1 tablet (500 mg total) by mouth 2 (two) times daily with a meal. (Patient not  taking: Reported on 07/17/2014)  . [DISCONTINUED] dextromethorphan (DELSYM) 30 MG/5ML liquid Take 5 mLs (30 mg total) by mouth as needed for cough. (Patient not taking: Reported on 07/17/2014)  . [DISCONTINUED] famotidine (PEPCID) 20 MG tablet Take 1 tablet (20 mg total) by mouth at bedtime. (Patient not taking: Reported on 07/17/2014)

## 2014-07-17 NOTE — Patient Instructions (Addendum)
Nasonex once daily Increase Zyrtec to 10mg  daily Continue omeprazole and tessalon as directed Appointment with Dr. Sherene SiresWert at [address], [place] [phone] on 08-19-14 at 8:45am.

## 2014-07-18 NOTE — Assessment & Plan Note (Signed)
Cyclical cough d/t upper airway instability , post nasal drip, GERD.  No primary lung disease,  Recent Sinus infxn now clear Plan Nasonex once daily Increase Zyrtec to 10mg  daily Continue omeprazole and tessalon as directed Referral to Dr Sherene SiresWert for second opinion and long term f/u of Cough, pt is amenable to this referral.

## 2014-08-19 ENCOUNTER — Institutional Professional Consult (permissible substitution): Payer: Commercial Managed Care - PPO | Admitting: Internal Medicine

## 2015-05-09 ENCOUNTER — Encounter: Payer: Self-pay | Admitting: Emergency Medicine

## 2015-05-09 ENCOUNTER — Emergency Department (INDEPENDENT_AMBULATORY_CARE_PROVIDER_SITE_OTHER)
Admission: EM | Admit: 2015-05-09 | Discharge: 2015-05-09 | Disposition: A | Discharge status: Home / Self Care | Payer: Commercial Managed Care - PPO | Source: Home / Self Care | Attending: Family Medicine | Admitting: Family Medicine

## 2015-05-09 DIAGNOSIS — K0510 Chronic gingivitis, plaque induced: Secondary | ICD-10-CM | POA: Diagnosis not present

## 2015-05-09 MED ORDER — PENICILLIN V POTASSIUM 500 MG PO TABS: 500.0000 mg | ORAL_TABLET | Freq: Three times a day (TID) | ORAL | Status: DC

## 2015-05-09 NOTE — ED Provider Notes (Signed)
CSN: [id number]     Arrival date & time 05/09/15  1215 History   First MD Initiated Contact with Patient 05/09/15 1354     Chief Complaint  Patient presents with  . Fever  . Oral Swelling      HPI Comments: This morning patient awoke with fever to 102, headache, and swelling/tenderness in her anterior upper gums.  No toothache.  Patient is a 64 y.o. female presenting with mouth sores. The history is provided by the patient.  Mouth Lesions Location:  Upper gingiva Upper gingiva location: left labial gingiva. Quality:  Red and painful Pain details:    Quality:  Aching   Severity:  Mild   Duration:  5 hours   Timing:  Constant   Progression:  Unchanged Onset quality:  Sudden Severity:  Mild Duration:  5 hours Progression:  Unchanged Chronicity:  New Context: not a change in medications   Relieved by:  None tried Worsened by:  Eating Ineffective treatments:  None tried Associated symptoms: fever   Associated symptoms: no congestion, no dental pain, no ear pain, no malaise, no neck pain, no rash, no rhinorrhea, no sore throat and no swollen glands     Past Medical History  Diagnosis Date  . Hypertension   . Depression   . Anxiety   . Ovarian cyst   . GERD (gastroesophageal reflux disease)   . Colon polyp   . Cough    Past Surgical History  Procedure Laterality Date  . Total abdominal hysterectomy  1982  . Cervical disc surgery  2004  . Breast biopsy  1982  . Wrist surgery      cyst   Family History  Problem Relation Age of Onset  . Diabetes Mother   . Heart failure Mother   . Heart disease Mother   . Cancer Mother     Uterine Cancer/ Bladder Cancer  . Heart disease Father   . Sarcoidosis Sister   . Heart disease Brother   . Diabetes Maternal Aunt   . Diabetes Maternal Uncle   . Diabetes Maternal Grandmother   . Melanoma Maternal Grandmother    Social History  Substance Use Topics  . Smoking status: Former Smoker -- 1.00 packs/day for 15 years    Types:  Cigarettes    Quit date: 05/04/1997  . Smokeless tobacco: Never Used  . Alcohol Use: No   OB History    No data available     Review of Systems  Constitutional: Positive for fever.  HENT: Positive for mouth sores. Negative for congestion, ear pain, rhinorrhea and sore throat.   Musculoskeletal: Negative for neck pain.  Skin: Negative for rash.  All other systems reviewed and are negative.   Allergies  Augmentin and Morphine and related  Home Medications   Prior to Admission medications   Medication Sig Start Date End Date Taking? Authorizing Provider  benzonatate (TESSALON) 100 MG capsule Use 1-2 every 6 hours as needed for cough 05/26/14   Storm FriskPatrick E Wright, MD  cetirizine (ZYRTEC) 10 MG tablet Take 5 mg by mouth daily.     Historical Provider, MD  cetirizine (ZYRTEC) 10 MG tablet Take 1 tablet (10 mg total) by mouth daily. 07/17/14   Storm FriskPatrick E Wright, MD  cholecalciferol (VITAMIN D) 1000 UNITS tablet Take 1,000 Units by mouth daily.    Historical Provider, MD  cyclobenzaprine (FLEXERIL) 10 MG tablet Take 10 mg by mouth 3 (three) times daily as needed for muscle spasms.    Historical Provider,  MD  DULoxetine (CYMBALTA) 60 MG capsule Take 1 capsule (60 mg total) by mouth daily. 02/06/14 02/06/15  Gillian Scarce, MD  mometasone (NASONEX) 50 MCG/ACT nasal spray Place 2 sprays into the nose 2 (two) times daily. 09/10/13   Storm Frisk, MD  nebivolol (BYSTOLIC) 5 MG tablet Take 1 tablet (5 mg total) by mouth daily. 09/13/13 09/13/14  Gillian Scarce, MD  omeprazole (PRILOSEC) 40 MG capsule Take 1 capsule (40 mg total) by mouth daily. 07/17/14 07/18/15  Storm Frisk, MD  penicillin v potassium (VEETID) 500 MG tablet Take 1 tablet (500 mg total) by mouth 3 (three) times daily. 05/09/15   Lattie Haw, MD  triamterene-hydrochlorothiazide (MAXZIDE-25) 37.5-25 MG per tablet Take 1 tablet by mouth daily. 09/13/13 09/13/14  Gillian Scarce, MD   Meds Ordered and Administered this Visit    Medications - No data to display  BP 135/83 mmHg  Pulse 74  Temp(Src) 98 F (36.7 C) (Oral)  Wt 196 lb (88.905 kg)  SpO2 96% No data found.   Physical Exam  Constitutional: She is oriented to person, place, and time. She appears well-developed and well-nourished. No distress.  HENT:  Head: Normocephalic.  Nose: Nose normal.  Mouth/Throat: No trismus in the jaw.    Left maxillary gingiva erythematous and tender to palpation as noted on diagram.    Eyes: Conjunctivae are normal. Pupils are equal, round, and reactive to light.  Neck: Neck supple.  Cardiovascular: Normal heart sounds.   Pulmonary/Chest: Breath sounds normal.  Lymphadenopathy:    She has no cervical adenopathy.  Neurological: She is alert and oriented to person, place, and time.  Skin: Skin is warm and dry. No rash noted.  Nursing note and vitals reviewed.   ED Course  Procedures none  MDM   1. Gingivitis    Begin Pen VK  Q8hr. Try warm salt water gargles for sore throat.  Followup with dentist if not improved 5 days.    Lattie Haw, MD 05/12/15 [phone]

## 2015-05-09 NOTE — Discharge Instructions (Signed)
Gingivitis °Gingivitis is a form of gum (periodontal) disease that causes redness, soreness, and swelling (inflammation) of your gums. °CAUSES °The most common cause of gingivitis is poor oral hygiene. A sticky substance made of bacteria, mucus, and food particles (plaque), is deposited on the exposed part of teeth. As plaque builds up, it reacts with the saliva in your mouth to form something called  tartar. Tartar is a hard deposit that becomes trapped around the base of the tooth. Plaque and tartar irritate the gums, leading to the formation of gingivitis. Other factors that increase your risk for gingivitis include:  °· Tobacco use. °· Diabetes. °· Older age. °· Certain medications. °· Certain viral or fungal infections. °· Dry mouth. °· Hormonal changes such as during pregnancy. °· Poor nutrition. °· Substance abuse. °· Poor fitting dental restorations or appliances. °SYMPTOMS °You may notice inflammation of the soft tissue (gingiva) around the teeth. When these tissues become inflamed, they bleed easily, especially during flossing or brushing. The gums may also be:  °· Tender to the touch. °· Bright red, purple red, or have a shiny appearance. °· Swollen. °· Wearing away from the teeth (receding), which exposes more of the tooth. °Bad breath is often present. Continued infection around teeth can eventually cause cavities and loosen teeth. This may lead to eventual tooth loss. °DIAGNOSIS °A medical and dental history will be taken. Your mouth, teeth, and gums will be examined. Your dentist will look for soft, swollen purple-red, irritated gums. There may be deposits of plaque and tartar at the base of the teeth. Your gums will be looked at for the degree of redness, puffiness, and bleeding tendencies. Your dentist will see if any of the teeth are loose. X-rays may be taken to see if the inflammation has spread to the supporting structures of the teeth. °TREATMENT °The goal is to reduce and reverse the  inflammation. Proper treatment can usually reverse the symptoms of gingivitis and prevent further progression of the disease. Have your teeth cleaned. During the cleaning, all plaque and tartar will be removed. Instruction for proper home care will be given. You will need regular professional cleanings and check-ups in the future. °HOME CARE INSTRUCTIONS °· Brush your teeth twice a day and floss at least once per day. When flossing, it is best to floss first then brush. °· Limit sugar between meals and maintain a well-balanced diet.  °· Even the best dental hygiene will not prevent plaque from developing. It is necessary for you to see your dentist on a regular basis for cleaning and regular checkups. °· Your dentist can recommend proper oral hygiene and mouth care and suggest special toothpastes or mouth rinses. °· Stop smoking. °SEEK DENTAL OR MEDICAL CARE IF: °· You have painful, reddened tissue around your teeth, or you have puffy swollen gums. °· You have difficulty chewing. °· You notice any loose or infected teeth. °· You have swollen glands. °· Your gums bleed easily when you brush your teeth or are very tender to the touch. °  °This information is not intended to replace advice given to you by your health care provider. Make sure you discuss any questions you have with your health care provider. °  °Document Released: 12/14/2000 Document Revised: 09/12/2011 Document Reviewed: 02/02/2015 °Elsevier Interactive Patient Education ©2016 [employer] ° °

## 2015-05-09 NOTE — ED Notes (Signed)
Pt c/o fever, HA  and swollen gums that started last night.

## 2015-05-10 ENCOUNTER — Telehealth: Payer: Self-pay | Admitting: Emergency Medicine

## 2015-09-04 ENCOUNTER — Ambulatory Visit (INDEPENDENT_AMBULATORY_CARE_PROVIDER_SITE_OTHER): Payer: Commercial Managed Care - PPO | Admitting: Internal Medicine

## 2015-09-04 ENCOUNTER — Other Ambulatory Visit (INDEPENDENT_AMBULATORY_CARE_PROVIDER_SITE_OTHER): Payer: Commercial Managed Care - PPO

## 2015-09-04 ENCOUNTER — Encounter: Payer: Self-pay | Admitting: Internal Medicine

## 2015-09-04 VITALS — BP 138/81 | HR 82 | Ht 61.0 in | Wt 200.4 lb

## 2015-09-04 DIAGNOSIS — R05 Cough: Secondary | ICD-10-CM | POA: Diagnosis not present

## 2015-09-04 DIAGNOSIS — R059 Cough, unspecified: Secondary | ICD-10-CM

## 2015-09-04 LAB — CBC WITH DIFFERENTIAL/PLATELET
BASOS PCT: 0.3 % (ref 0.0–3.0)
Basophils Absolute: 0 10*3/uL (ref 0.0–0.1)
EOS ABS: 0.1 10*3/uL (ref 0.0–0.7)
EOS PCT: 0.8 % (ref 0.0–5.0)
HCT: 47.3 % — ABNORMAL HIGH (ref 36.0–46.0)
HEMOGLOBIN: 16.2 g/dL — AB (ref 12.0–15.0)
LYMPHS ABS: 2.4 10*3/uL (ref 0.7–4.0)
Lymphocytes Relative: 26.3 % (ref 12.0–46.0)
MCHC: 34.3 g/dL (ref 30.0–36.0)
MCV: 85.1 fl (ref 78.0–100.0)
MONO ABS: 0.7 10*3/uL (ref 0.1–1.0)
Monocytes Relative: 7.6 % (ref 3.0–12.0)
NEUTROS PCT: 65 % (ref 43.0–77.0)
Neutro Abs: 5.9 10*3/uL (ref 1.4–7.7)
PLATELETS: 149 10*3/uL — AB (ref 150.0–400.0)
RBC: 5.56 Mil/uL — ABNORMAL HIGH (ref 3.87–5.11)
RDW: 13.9 % (ref 11.5–15.5)
WBC: 9.1 10*3/uL (ref 4.0–10.5)

## 2015-09-04 NOTE — Assessment & Plan Note (Addendum)
The most common causes of chronic cough in immunocompetent adults include the following: upper airway cough syndrome (UACS), previously referred to as postnasal drip syndrome (PNDS), which is caused by variety of rhinosinus conditions; (2) asthma; (3) GERD; (4) chronic bronchitis from cigarette smoking or other inhaled environmental irritants; (5) nonasthmatic eosinophilic bronchitis; and (6) bronchiectasis.   These conditions, singly or in combination, have accounted for up to 94% of the causes of chronic cough in prospective studies.   Other conditions have constituted no >6% of the causes in prospective studies These have included bronchogenic carcinoma, chronic interstitial pneumonia, sarcoidosis, left ventricular failure, ACEI-induced cough, and aspiration from a condition associated with pharyngeal dysfunction.    Chronic cough is often simultaneously caused by more than one condition. A single cause has been found from 38 to 82% of the time, multiple causes from 18 to 62%. Multiply caused cough has been the result of three diseases up to 42% of the time.       Based on hx and exam, this is most likely:  Classic Upper airway cough syndrome, so named because it's frequently impossible to sort out how much is  CR/sinusitis with freq throat clearing (which can be related to primary GERD)   vs  causing  secondary (" extra esophageal")  GERD from wide swings in gastric pressure that occur with throat clearing, often  promoting self use of mint and menthol lozenges that reduce the lower esophageal sphincter tone and exacerbate the problem further in a cyclical fashion.   These are the same pts (now being labeled as having "irritable larynx syndrome" by some cough centers) who not infrequently have a history of having failed to tolerate ace inhibitors,  dry powder inhalers or biphosphonates or report having atypical reflux symptoms that don't respond to standard doses of PPI , and are easily confused as  having aecopd or asthma flares by even experienced allergists/ pulmonologists.   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 Therefore if adherence is an issue or can't be accurately verified,  it's very unlikely the standard evaluation and treatment will be successful here.    Furthermore, response to therapy (other than acute cough suppression, which should only be used short term with avoidance of narcotic containing cough syrups if possible), can be a gradual process for which the patient may perceive immediate benefit.  Unlike going to an eye doctor where the best perscription is almost always the first one and is immediately effective, this is almost never the case in the management of chronic cough syndromes. Therefore the patient needs to commit up front to consistently adhere to recommendations  for up to 6 weeks of therapy directed at the likely  underlying problem(s) before the response can be reasonably evaluated.     2) Each maintenance medication was reviewed in detail including most importantly the difference between maintenance and prns and under what circumstances the prns are to be triggered using an action plan format that is not  reflected in the computer generated alphabetically organized AVS.    Please see instructions for details which were reviewed in writing and the patient given a copy highlighting the part that I personally wrote and discussed at today's ov.   See instructions for specific recommendations which were reviewed directly with the patient who was given a copy with highlighter outlining the key components.

## 2015-09-04 NOTE — Progress Notes (Signed)
Subjective:    Patient ID: Theresa Lamb, female    DOB: [birth year],    MRN: [id number]  HPI  Brief patient profile:  46 yowf quit Nov 1998  with no respiratory symptoms except some mild seasonal rhinitis seasonal worse as got older  with cough since Nov 2012 severe URI  abrupt onset cough  Dx as bronchitis / sinusitis but never completely recovered despite eval by UC/ENT/primary care and GI and Dr Delford Field with dx GERD so presented back to pulmonary clinic  09/04/2015 with persistent cough with voice use and best zyrtec candy.   Final ov Dr Delford Field 07/17/14 Nasonex once daily Increase Zyrtec to  daily Continue omeprazole and tessalon as directed Referral to Dr Sherene Sires for second opinion and long term f/u of Cough, pt is amenable to this referral.   History of Present Illness  09/04/2015 1st [facility] office visit/ Ileana Chalupa re: consultation re chronic cough  Chief Complaint  Patient presents with  . Pulmonary Consult    Former patient of Dr Delford Field. She c/o cough x 4 yrs- non prod. She states cough seems worse after eating and when talking.   cough daily 75% better with zyrtec and hard candy and prilosec / has tried gabapentin ? Dose could not tol (was not rx'd for cough though)     No obvious day to day or daytime variability or assoc chronic cough or cp or chest tightness, subjective wheeze or overt sinus or hb symptoms. No unusual exp hx or h/o childhood pna/ asthma or knowledge of premature birth.  Sleeping ok without nocturnal  or early am exacerbation  of respiratory  c/o's or need for noct saba. Also denies any obvious fluctuation of symptoms with weather or environmental changes or other aggravating or alleviating factors except as outlined above   Current Medications, Allergies, Complete Past Medical History, Past Surgical History, Family History, and Social History were reviewed in [employer] record.  ROS  The following are not active complaints  unless bolded sore throat, dysphagia, dental problems, itching, sneezing,  nasal congestion or excess/ purulent secretions, ear ache,   fever, chills, sweats, unintended wt loss, classically pleuritic or exertional cp, hemoptysis,  orthopnea pnd or leg swelling, presyncope, palpitations, abdominal pain, anorexia, nausea, vomiting, diarrhea  or change in bowel or bladder habits, change in stools or urine, dysuria,hematuria,  rash, arthralgias, visual complaints, headache, numbness, weakness or ataxia or problems with walking or coordination,  change in mood/affect or memory.          Review of Systems  Constitutional: Negative for fever, chills and unexpected weight change.  HENT: Positive for congestion. Negative for dental problem, ear pain, nosebleeds, postnasal drip, rhinorrhea, sinus pressure, sneezing, sore throat, trouble swallowing and voice change.   Eyes: Negative for visual disturbance.  Respiratory: Positive for cough. Negative for choking and shortness of breath.   Cardiovascular: Negative for chest pain and leg swelling.  Gastrointestinal: Negative for vomiting, abdominal pain and diarrhea.  Genitourinary: Negative for difficulty urinating.  Musculoskeletal: Positive for arthralgias.  Skin: Negative for rash.  Neurological: Negative for tremors, syncope and headaches.  Hematological: Does not bruise/bleed easily.       Objective:   Physical Exam  amb obese wf nad with occ throat clearing   Wt Readings from Last 3 Encounters:  09/04/15 200 lb 6.4 oz (90.901 kg)  05/09/15 196 lb (88.905 kg)  07/17/14 196 lb (88.905 kg)    Vital signs reviewed   HEENT: nl dentition,  turbinates, and oropharynx.   Wax impaction on R and scarring    NECK :  without JVD/Nodes/TM/ nl carotid upstrokes bilaterally   LUNGS: no acc muscle use,  Nl contour chest which is clear to A and P bilaterally without cough on insp or exp maneuvers   CV:  RRR  no s3 or murmur or increase in P2, no  edema   ABD:  soft and nontender with nl inspiratory excursion in the supine position. No bruits or organomegaly, bowel sounds nl  MS:  Nl gait/ ext warm without deformities, calf tenderness, cyanosis or clubbing No obvious joint restrictions   SKIN: warm and dry without lesions    NEURO:  alert, approp, nl sensorium with  no motor deficits   Labs 09/04/2015   Allergy profile            Assessment & Plan:

## 2015-09-04 NOTE — Patient Instructions (Addendum)
Please remember to go to the lab department downstairs for your tests - we will call you with the results when they are available.  Prilosec 40 mg Take 30-60 min before first meal of the day and Pepcid ac  20 mg  At bedtime   Nasonex at bedtime automatically for now   For cough tessilon  Four times a day if need to get 100% suppression x 5 days then try backing off completely to see if cough stays gone    For drainage / throat tickle try take CHLORPHENIRAMINE  4 mg - take one every 4 hours as needed - available over the counter- may cause drowsiness so start with just a bedtime dose or two and see how you tolerate it before trying in daytime    GERD (REFLUX)  is an extremely common cause of respiratory symptoms just like yours , many times with no obvious heartburn at all.    It can be treated with medication, but also with lifestyle changes including elevation of the head of your bed (ideally with 6 inch  bed blocks),  Smoking cessation, avoidance of late meals, excessive alcohol, and avoid fatty foods, chocolate, peppermint, colas, red wine, and acidic juices such as orange juice.  NO MINT OR MENTHOL PRODUCTS SO NO COUGH DROPS  USE SUGARLESS CANDY INSTEAD (Jolley ranchers or Stover's or Life Savers) or even ice chips will also do - the key is to swallow to prevent all throat clearing. NO OIL BASED VITAMINS - use powdered substitutes.  Please schedule a follow up office visit in 4 weeks, sooner if needed

## 2015-09-08 LAB — RESPIRATORY ALLERGY PROFILE REGION II ~~LOC~~
Allergen, Cottonwood, t14: <0.1 kU/L
Allergen, D pternoyssinus,d7: <0.1 kU/L
Allergen, Mouse Urine Protein, e78: <0.1 kU/L
Alternaria Alternata: <0.1 kU/L
Cladosporium Herbarum: <0.1 kU/L
Common Ragweed: <0.1 kU/L
IgE (Immunoglobulin E), Serum: 28 kU/L (ref <115)
Penicillium Notatum: <0.1 kU/L
Rough Pigweed  IgE: <0.1 kU/L
Sheep Sorrel IgE: <0.1 kU/L

## 2015-09-08 NOTE — Progress Notes (Signed)
Quick Note:  Spoke with pt and notified of results per Dr. Wert. Pt verbalized understanding and denied any questions.  ______ 

## 2015-09-09 ENCOUNTER — Telehealth: Payer: Self-pay | Admitting: Internal Medicine

## 2015-09-09 NOTE — Telephone Encounter (Signed)
Patient calling to get results of labs and CXR.  Patient says that she received a message from CameronLeslie on her voicemail requesting a call back, but did not receive results. Advised patient of results of labs and CXR.  Nothing further needed.

## 2015-10-02 ENCOUNTER — Encounter: Payer: Self-pay | Admitting: Internal Medicine

## 2015-10-02 ENCOUNTER — Ambulatory Visit (INDEPENDENT_AMBULATORY_CARE_PROVIDER_SITE_OTHER): Payer: Commercial Managed Care - PPO | Admitting: Internal Medicine

## 2015-10-02 VITALS — BP 134/78 | HR 66 | Ht 61.5 in | Wt 202.0 lb

## 2015-10-02 DIAGNOSIS — R059 Cough, unspecified: Secondary | ICD-10-CM

## 2015-10-02 DIAGNOSIS — R05 Cough: Secondary | ICD-10-CM | POA: Diagnosis not present

## 2015-10-02 MED ORDER — GABAPENTIN 100 MG PO CAPS: 100.0000 mg | ORAL_CAPSULE | Freq: Three times a day (TID) | ORAL | Status: AC

## 2015-10-02 NOTE — Patient Instructions (Addendum)
Neurontin 100 mg one at bedtime for a couple of weeks then work up to three times daily if not better   Try off zyrtec to see if helps the mouth dryness  Continue chlortrimeton 4 mg every 4 hours as needed   Continue acid suppression as you are until you return plus the diet/ hard candy (no mint menthol chocolate!)  Ok to stop tessalon  Elevate head of bed x 6 in  Please schedule a follow up office visit in 4 weeks, sooner if needed

## 2015-10-02 NOTE — Progress Notes (Signed)
Subjective:    Patient ID: Theresa Lamb, female    DOB: [birth year],    MRN: [id number]     Brief patient profile:  35 yowf quit Nov 1998  with no respiratory symptoms except some mild seasonal rhinitis seasonal worse as got older  with cough since Nov 2012 severe URI  abrupt onset cough  Dx as bronchitis / sinusitis but never completely recovered despite eval by UC/ENT/primary care and GI and Dr Delford Field with dx GERD so presented back to pulmonary clinic  09/04/2015 with persistent cough with voice use and best zyrtec candy.   Final ov Dr Delford Field 07/17/14 Nasonex once daily Increase Zyrtec to  daily Continue omeprazole and tessalon as directed Referral to Dr Sherene Sires for second opinion and long term f/u of Cough, pt is amenable to this referral.   History of Present Illness  09/04/2015 1st [facility] office visit/ Wert re: consultation re chronic cough  Chief Complaint  Patient presents with  . Pulmonary Consult    Former patient of Dr Delford Field. She c/o cough x 4 yrs- non prod. She states cough seems worse after eating and when talking.   cough daily 75% better with zyrtec and hard candy and prilosec / has tried gabapentin ? Dose could not tol (was not rx'd for cough though)  rec Prilosec 40 mg Take 30-60 min before first meal of the day and Pepcid ac  20 mg  At bedtime  Nasonex at bedtime automatically for now  For cough tessilon  Four times a day if need to get 100% suppression x 5 days then try backing off completely to see if cough stays gone   For drainage / throat tickle try take CHLORPHENIRAMINE  4 mg - take one every 4 hours as needed - available over the counter- may cause drowsiness so start with just a bedtime dose or two and see how you tolerate it before trying in daytime   GERD diet     10/02/2015  f/u ov/Wert re: cough x 4 years   Chief Complaint  Patient presents with  . Follow-up    Cough is unchanged. No new co's today.    wakes up coughing every night x 4  years no change so far with hs chlortabs/ also on zyrtec qam cc mouth dry      Kouffman Reflux v Neurogenic Cough Differentiator Reflux Comments  Do you awaken from a sound sleep coughing violently?                            With trouble breathing? No / but do wake up 3-4am   Do you have choking episodes when you cannot  Get enough air, gasping for air ?              No/ gag   Do you usually cough when you lie down into  The bed, or when you just lie down to rest ?                          No    Do you usually cough after meals or eating?         Sometimes after meals    Do you cough when (or after) you bend over?    no   GERD SCORE     Kouffman Reflux v Neurogenic Cough Differentiator Neurogenic   Do you more-or-less cough all day long?  sporadic   Does change of temperature make you cough? Real cold   Does laughing or chuckling cause you to cough? some   Do fumes (perfume, automobile fumes, burned  Toast, etc.,) cause you to cough ?      yes   Does speaking, singing, or talking on the phone cause you to cough   ?               Yes    Neurogenic/Airway score        No obvious day to day or daytime variability or assoc excess/ purulent sputum or mucus plugs   cp or chest tightness, subjective wheeze or overt sinus or hb symptoms. No unusual exp hx or h/o childhood pna/ asthma or knowledge of premature birth.  Sleeping ok without nocturnal  or early am exacerbation  of respiratory  c/o's or need for noct saba. Also denies any obvious fluctuation of symptoms with weather or environmental changes or other aggravating or alleviating factors except as outlined above   Current Medications, Allergies, Complete Past Medical History, Past Surgical History, Family History, and Social History were reviewed in [facility] electronic medical record.  ROS  The following are not active complaints unless bolded sore throat, dysphagia, dental problems, itching, sneezing,  nasal congestion or  excess/ purulent secretions, ear ache,   fever, chills, sweats, unintended wt loss, classically pleuritic or exertional cp, hemoptysis,  orthopnea pnd or leg swelling, presyncope, palpitations, abdominal pain, anorexia, nausea, vomiting, diarrhea  or change in bowel or bladder habits, change in stools or urine, dysuria,hematuria,  rash, arthralgias, visual complaints, headache, numbness, weakness or ataxia or problems with walking or coordination,  change in mood/affect or memory.             Objective:   Physical Exam  amb obese wf nad with no  throat clearing   10/02/2015        202   09/04/15 200 lb 6.4 oz (90.901 kg)  05/09/15 196 lb (88.905 kg)  07/17/14 196 lb (88.905 kg)    Vital signs reviewed   HEENT: nl dentition, turbinates, and oropharynx.     NECK :  without JVD/Nodes/TM/ nl carotid upstrokes bilaterally   LUNGS: no acc muscle use,  Nl contour chest which is clear to A and P bilaterally without cough on insp or exp maneuvers   CV:  RRR  no s3 or murmur or increase in P2, no edema   ABD:  soft and nontender with nl inspiratory excursion in the supine position. No bruits or organomegaly, bowel sounds nl  MS:  Nl gait/ ext warm without deformities, calf tenderness, cyanosis or clubbing No obvious joint restrictions   SKIN: warm and dry without lesions    NEURO:  alert, approp, nl sensorium with  no motor deficits             Assessment & Plan:

## 2015-10-02 NOTE — Assessment & Plan Note (Signed)
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 Doubt primary lung disease - Allergy profile 09/04/2015 >  Eos 0.1/  IgE  28 neg RAST - Neurontin 100 mg tid 10/02/2015    Will focus first on noct cough and if not better MCT then [facility]  I had an extended discussion with the patient reviewing all relevant studies completed to date and  lasting 15 to 20 minutes of a 25 minute visit    Each maintenance medication was reviewed in detail including most importantly the difference between maintenance and prns and under what circumstances the prns are to be triggered using an action plan format that is not reflected in the computer generated alphabetically organized AVS.    Please see instructions for details which were reviewed in writing and the patient given a copy highlighting the part that I personally wrote and discussed at today's ov.

## 2015-10-30 ENCOUNTER — Ambulatory Visit: Payer: Commercial Managed Care - PPO | Admitting: Internal Medicine

## 2015-11-20 ENCOUNTER — Ambulatory Visit: Payer: Commercial Managed Care - PPO | Admitting: Internal Medicine

## 2020-05-02 ENCOUNTER — Ambulatory Visit: Payer: Commercial Managed Care - PPO
